# Patient Record
Sex: Female | Born: 1955 | Race: White | Hispanic: No | Marital: Married | Smoking: Never smoker
Health system: Southern US, Community
[De-identification: ages and names within clinical notes are randomized; demographics above are authoritative.]

## PROBLEM LIST (undated history)

## (undated) DIAGNOSIS — I10 Essential (primary) hypertension: Secondary | ICD-10-CM

## (undated) HISTORY — DX: Essential (primary) hypertension: I10

## (undated) HISTORY — PX: COLONOSCOPY: SHX174

## (undated) HISTORY — PX: CHOLECYSTECTOMY: SHX55

---

## 1998-03-11 ENCOUNTER — Ambulatory Visit (HOSPITAL_COMMUNITY): Admission: RE | Admit: 1998-03-11 | Discharge: 1998-03-11 | Payer: Self-pay | Admitting: Oncology

## 2004-02-09 ENCOUNTER — Other Ambulatory Visit: Payer: Self-pay

## 2007-05-22 ENCOUNTER — Ambulatory Visit: Payer: Self-pay | Admitting: Family Medicine

## 2009-01-30 ENCOUNTER — Emergency Department: Payer: Self-pay | Admitting: Emergency Medicine

## 2010-03-30 ENCOUNTER — Emergency Department: Payer: Self-pay | Admitting: Emergency Medicine

## 2011-02-03 ENCOUNTER — Ambulatory Visit: Payer: Self-pay | Admitting: Internal Medicine

## 2011-10-29 ENCOUNTER — Emergency Department: Payer: Self-pay | Admitting: Unknown Physician Specialty

## 2011-11-05 ENCOUNTER — Emergency Department: Payer: Self-pay | Admitting: Emergency Medicine

## 2011-11-12 ENCOUNTER — Ambulatory Visit: Payer: Self-pay | Admitting: Family Medicine

## 2011-11-12 LAB — CREATININE, SERUM
Creatinine: 0.64 mg/dL (ref 0.60–1.30)
EGFR (African American): >60
EGFR (Non-African Amer.): >60

## 2011-12-17 DIAGNOSIS — Z1211 Encounter for screening for malignant neoplasm of colon: Secondary | ICD-10-CM | POA: Insufficient documentation

## 2011-12-31 DIAGNOSIS — K648 Other hemorrhoids: Secondary | ICD-10-CM | POA: Insufficient documentation

## 2012-06-18 ENCOUNTER — Ambulatory Visit: Payer: Self-pay | Admitting: Family Medicine

## 2012-06-20 ENCOUNTER — Emergency Department: Payer: Self-pay | Admitting: *Deleted

## 2012-06-20 LAB — COMPREHENSIVE METABOLIC PANEL
Albumin: 3.8 g/dL (ref 3.4–5.0)
Alkaline Phosphatase: 93 U/L (ref 50–136)
Anion Gap: 4 — ABNORMAL LOW (ref 7–16)
BUN: 13 mg/dL (ref 7–18)
Bilirubin,Total: 0.2 mg/dL (ref 0.2–1.0)
Calcium, Total: 9.5 mg/dL (ref 8.5–10.1)
Chloride: 106 mmol/L (ref 98–107)
Co2: 32 mmol/L (ref 21–32)
Creatinine: 0.79 mg/dL (ref 0.60–1.30)
EGFR (African American): >60
EGFR (Non-African Amer.): >60
Glucose: 88 mg/dL (ref 65–99)
Osmolality: 283 (ref 275–301)
Potassium: 3.8 mmol/L (ref 3.5–5.1)
SGOT(AST): 20 U/L (ref 15–37)
SGPT (ALT): 23 U/L (ref 12–78)
Sodium: 142 mmol/L (ref 136–145)
Total Protein: 7.3 g/dL (ref 6.4–8.2)

## 2012-06-20 LAB — CBC
HCT: 41.7 % (ref 35.0–47.0)
HGB: 14.5 g/dL (ref 12.0–16.0)
MCH: 33.3 pg (ref 26.0–34.0)
MCHC: 34.7 g/dL (ref 32.0–36.0)
MCV: 96 fL (ref 80–100)
Platelet: 188 10*3/uL (ref 150–440)
RBC: 4.35 10*6/uL (ref 3.80–5.20)
RDW: 12.5 % (ref 11.5–14.5)
WBC: 8.6 10*3/uL (ref 3.6–11.0)

## 2012-06-20 LAB — URINALYSIS, COMPLETE
Bacteria: NONE SEEN
Bilirubin,UR: NEGATIVE
Blood: NEGATIVE
Glucose,UR: NEGATIVE mg/dL (ref 0–75)
Ketone: NEGATIVE
Leukocyte Esterase: NEGATIVE
Nitrite: NEGATIVE
Ph: 7 (ref 4.5–8.0)
Protein: NEGATIVE
RBC,UR: 1 /HPF (ref 0–5)
Specific Gravity: 1.003 (ref 1.003–1.030)
Squamous Epithelial: <1
WBC UR: <1 /HPF (ref 0–5)

## 2012-06-20 LAB — LIPASE, BLOOD: Lipase: 113 U/L (ref 73–393)

## 2012-06-30 ENCOUNTER — Ambulatory Visit: Payer: Self-pay | Admitting: Surgery

## 2012-07-02 LAB — PATHOLOGY REPORT

## 2013-04-29 ENCOUNTER — Ambulatory Visit: Payer: Self-pay | Admitting: Family Medicine

## 2013-11-10 ENCOUNTER — Ambulatory Visit: Payer: Self-pay | Admitting: Family Medicine

## 2014-01-26 ENCOUNTER — Other Ambulatory Visit: Payer: Self-pay | Admitting: Podiatry

## 2014-01-26 ENCOUNTER — Encounter: Payer: Self-pay | Admitting: Podiatry

## 2014-02-11 ENCOUNTER — Ambulatory Visit: Payer: Self-pay | Admitting: Emergency Medicine

## 2014-03-25 DIAGNOSIS — M79671 Pain in right foot: Secondary | ICD-10-CM | POA: Insufficient documentation

## 2014-03-25 DIAGNOSIS — Z8669 Personal history of other diseases of the nervous system and sense organs: Secondary | ICD-10-CM | POA: Insufficient documentation

## 2014-03-25 DIAGNOSIS — I10 Essential (primary) hypertension: Secondary | ICD-10-CM | POA: Insufficient documentation

## 2014-03-25 DIAGNOSIS — G43909 Migraine, unspecified, not intractable, without status migrainosus: Secondary | ICD-10-CM | POA: Insufficient documentation

## 2014-06-17 ENCOUNTER — Ambulatory Visit: Payer: Self-pay | Admitting: Physician Assistant

## 2015-02-22 NOTE — H&P (Signed)
PATIENT NAME:  Deborah Estrada, Deborah Estrada MR#:  [id number] DATE OF BIRTH:  Dec 12, 1955  DATE OF ADMISSION:  06/30/2012  CHIEF COMPLAINT: Right upper quadrant pain.   HISTORY OF PRESENT ILLNESS: This patient was seen in the office on August 20th. She was describing several weeks of abdominal pain in the right upper quadrant with nausea and vomiting. She had no jaundice, acholic stools. She had been constipated, had not had any fevers or chills. She had had prior episodes of this leading up to this most recent event. At the time, the patient was diagnosed with gallstones and probable acute cholecystitis, and I had recommended admission to the hospital with urgent laparoscopic cholecystectomy; and once I discussed the risks associated with the procedure, the patient refused admission and did not want to have surgery. The patient called back later stating that she would consider surgery and would be interested in exposing herself to the risks that I had discussed; therefore, she is here for elective laparoscopic cholecystectomy for the diagnosis of gallstones and possible acute cholecystitis. Of note, the patient will be consented concerning those risks that we had discussed in the office, and these will be reviewed in detail for her in the preop holding area prior to the start of surgery.   PAST MEDICAL HISTORY: Hypertension.   PAST SURGICAL HISTORY: None.   MEDICATIONS: Toprol.   ALLERGIES: None.   FAMILY HISTORY: Noncontributory.   SOCIAL HISTORY: The patient does not smoke nor does she drink.   REVIEW OF SYSTEMS: Ten-system review has been performed and negative with the exception of that mentioned in the history of present illness.   PHYSICAL EXAMINATION:  GENERAL: Uncomfortable-appearing Caucasian female patient.   HEENT: No scleral icterus.   NECK: No palpable neck nodes.   CHEST: Clear to auscultation.   CARDIAC: Regular rate and rhythm.   ABDOMEN: Tender in the right upper quadrant with a  positive Murphy's sign.   EXTREMITIES: Extremities are without edema. Calves are nontender.   NEUROLOGIC: Grossly intact.   INTEGUMENT: No jaundice.   LABORATORY, DIAGNOSTIC AND RADIOLOGICAL DATA: An ultrasound performed on the 16th of August demonstrated gallstones and sludge and a positive sonographic Murphy's sign. Laboratory values done on August 19th showed a white blood cell count of 8.6, hemoglobin and hematocrit of 14.5 and 42, AST/ALT were within normal limits.   ASSESSMENT AND PLAN: This is a patient with probable acute cholecystitis. She was seen in the office on the 20th at which time admission to the hospital and urgent cholecystectomy was suggested. The patient refused at that time making statements such as, paraphrasing, she did not want to have an open procedure and certainly did not want to have her gallbladder removed through her navel as her husband had.  I discussed the risks of bleeding and infection and recurrence of symptoms, failure to resolve her symptoms, open procedure, bile duct damage, bile duct leak, retained common bile duct stone, any of which could require further surgery and/or ERCP, stent, and papillotomy. I also discussed with her the details of the planned procedure at that time, and she refused admission and refused surgery. The patient called back later stating that she had decided that she would want to undergo surgery, and we scheduled her electively. These risks will be reviewed in detail in the preop holding area as well so that she is satisfactorily consented, and we will proceed with laparoscopic cholecystectomy on 06/30/2012.  ____________________________ Adah Salvage. Excell Seltzer, MD rec:cbb D: 06/29/2012 17:38:27 ET T: 06/30/2012 06:36:02  ET JOB#: [id number] Deborah HawICHARD E Ivyonna Hoelzel MD ELECTRONICALLY SIGNED 07/07/2012 7:18

## 2015-02-22 NOTE — Op Note (Signed)
PATIENT NAME:  Deborah Estrada, Deborah L MR#:  [id number] DATE OF BIRTH:  1956/02/15  DATE OF PROCEDURE:  06/30/2012  PREOPERATIVE DIAGNOSIS: Symptomatic cholelithiasis.   POSTOPERATIVE DIAGNOSIS: Symptomatic cholelithiasis.    PROCEDURE:  Laparoscopic cholecystectomy.   SURGEON: Dionne Miloichard Aaryan Essman, M.D.   ANESTHESIA: General with endotracheal tube.   INDICATIONS: This is a patient with epigastric right upper quadrant pain associated with fatty food intolerance and work-up showing gallstones and probable acute cholecystitis. I tried to admit her from the office for urgent laparoscopy last week and she refused to consent. Preoperatively in the office and then in the preop holding area, I reviewed with she and her family the rationale for offering surgery and the options of observation. I also discussed the risks of bleeding, infection, open procedure, conversion to an open procedure, bowel injury, bile duct damage, bile duct leak, retained common bile duct stone, any of which could require further surgery and/or ERCP, stent, and papillotomy. Multiple questions were answered for her and her family. She voiced understanding and agreement with the plan.   FINDINGS: Signs of chronic cholecystitis with multiple adhesions and engorged vessels.   DESCRIPTION OF PROCEDURE: The patient was induced to general anesthesia, given IV antibiotics, VTE prophylaxis was in place. She was prepped and draped in a sterile fashion. Marcaine was infiltrated in skin and subcutaneous tissues around the periumbilical area. Incision was made. A Veress needle was placed. Pneumoperitoneum was obtained and a 5 mm trocar port was placed. The abdominal cavity was explored and under direct vision a 10 mm epigastric port and two lateral 5 mm ports were placed. The gallbladder was placed on tension. Adhesions were taken down bluntly and sharply without the use of energy. The peritoneum over the infundibulum was incised bluntly. The cystic duct  gallbladder junction was well identified. Cystic lymphatics were doubly clipped and divided and then two branches of the cystic artery were doubly clipped and divided and the cystic vein was doubly clipped and divided as well. The gallbladder junction with the cystic duct was easily identifiable at this point. Therefore, it was doubly clipped and divided and the gallbladder was taken from the gallbladder fossa with electrocautery and passed out through the epigastric port site with the aid of an EndoCatch bag. The area was checked for hemostasis. Additional electrocautery was utilized in the area of the gallbladder fossa with good hemostasis at the end of the case. However, a piece of Surgicel was placed on this spot as well. No further bleeding was noted. Irrigation was aspirated and the camera was placed in the epigastric site to view back to the periumbilical site. There was no sign of bowel injury, bleeding or bile leak; therefore, pneumoperitoneum was released. All ports were removed. Fascial edges at the epigastric site were approximated with figure-of-eight 0 Vicryls. 4-0 subcuticular Monocryl was used at all skin edges. Steri-Strips, Mastisol, and sterile dressings were placed.   The patient tolerated the procedure well. There were no complications. She was taken to the recovery room in stable condition to be discharged to the care of her family. Follow-up in 10 days.   ____________________________ Adah Salvageichard E. Excell Seltzerooper, MD rec:ap D: 06/30/2012 10:09:36 ET T: 06/30/2012 12:06:00 ET JOB#: [id number]  cc: Adah Salvageichard E. Excell Seltzerooper, MD, <Dictator> Lattie HawICHARD E Marcellino Fidalgo MD ELECTRONICALLY SIGNED 07/07/2012 7:18

## 2015-11-08 ENCOUNTER — Ambulatory Visit: Payer: Self-pay | Admitting: Family Medicine

## 2015-12-06 ENCOUNTER — Ambulatory Visit: Payer: Self-pay | Admitting: Family Medicine

## 2016-07-24 DIAGNOSIS — G8929 Other chronic pain: Secondary | ICD-10-CM | POA: Insufficient documentation

## 2016-07-24 DIAGNOSIS — M542 Cervicalgia: Secondary | ICD-10-CM | POA: Insufficient documentation

## 2016-07-24 DIAGNOSIS — M5442 Lumbago with sciatica, left side: Secondary | ICD-10-CM

## 2016-07-24 DIAGNOSIS — M5430 Sciatica, unspecified side: Secondary | ICD-10-CM | POA: Insufficient documentation

## 2016-07-24 DIAGNOSIS — M25512 Pain in left shoulder: Secondary | ICD-10-CM

## 2016-07-24 DIAGNOSIS — M25511 Pain in right shoulder: Secondary | ICD-10-CM

## 2016-07-24 DIAGNOSIS — Z833 Family history of diabetes mellitus: Secondary | ICD-10-CM | POA: Insufficient documentation

## 2016-09-06 ENCOUNTER — Ambulatory Visit
Admission: EM | Admit: 2016-09-06 | Discharge: 2016-09-06 | Disposition: A | Discharge status: Home / Self Care | Payer: BLUE CROSS/BLUE SHIELD | Attending: Family Medicine | Admitting: Family Medicine

## 2016-09-06 DIAGNOSIS — J069 Acute upper respiratory infection, unspecified: Secondary | ICD-10-CM

## 2016-09-06 DIAGNOSIS — B9789 Other viral agents as the cause of diseases classified elsewhere: Secondary | ICD-10-CM | POA: Diagnosis not present

## 2016-09-06 LAB — RAPID STREP SCREEN (MED CTR MEBANE ONLY): Streptococcus, Group A Screen (Direct): NEGATIVE

## 2016-09-06 MED ORDER — AZITHROMYCIN 250 MG PO TABS: ORAL_TABLET | ORAL | 0 refills | Status: DC

## 2016-09-06 MED ORDER — FEXOFENADINE-PSEUDOEPHED ER 180-240 MG PO TB24: 1.0000 | ORAL_TABLET | Freq: Every day | ORAL | 0 refills | Status: DC

## 2016-09-06 MED ORDER — FLUTICASONE PROPIONATE 50 MCG/ACT NA SUSP: 2.0000 | Freq: Every day | NASAL | 0 refills | Status: DC

## 2016-09-06 MED ORDER — HYDROCOD POLST-CPM POLST ER 10-8 MG/5ML PO SUER: 5.0000 mL | Freq: Two times a day (BID) | ORAL | 0 refills | Status: DC | PRN

## 2016-09-06 NOTE — ED Provider Notes (Signed)
[facility]    CSN: [id number] Arrival date & time: 09/06/16  1637     History   Chief Complaint Chief Complaint  Patient presents with  . Sinusitis    HPI Deborah Estrada is a 60 y.o. female.   Patient with a 2 day history of URI symptoms. She states she feels miserable. Everything started Tuesday afternoon and is Thursday afternoon and. She states she's not able to sleep at night because the nasal drainage and cough and congestion. Her throat sore but she never gets strep. She denies any wheezing she does not smoke she did not have any known fever. She does have history high blood pressure. There is a history of breast cancer mother and heart attack in father. No known drug allergies. She states she normally to work and feels miserable because she does also feel achy all over   The history is provided by the patient. No language interpreter was used.  Sinusitis  Pain details:    Location:  Maxillary   Quality:  Aching   Severity:  Moderate   Duration:  3 days   Timing:  Constant Duration:  3 days Progression:  Worsening Chronicity:  New Context: recent URI   Context: not smoke inhalation   Relieved by:  Nothing Associated symptoms: congestion, cough, rhinorrhea and sore throat     Past Medical History:  Diagnosis Date  . HBP (high blood pressure)     There are no active problems to display for this patient.   Past Surgical History:  Procedure Laterality Date  . CHOLECYSTECTOMY    . COLONOSCOPY      OB History    No data available       Home Medications    Prior to Admission medications   Medication Sig Start Date End Date Taking? Authorizing Provider  meloxicam (MOBIC) 15 MG tablet Take 15 mg by mouth daily.   Yes Historical Provider, MD  azithromycin (ZITHROMAX Z-PAK) 250 MG tablet Take 2 tablets first day and then 1 po a day for 4 days patient may fill this prescription between 09/09/2016 and 10/04/2016 if symptoms still persist  09/06/16   Hassan Rowan, MD  celecoxib (CELEBREX) 200 MG capsule Take 200 mg by mouth daily.    Historical Provider, MD  chlorpheniramine-HYDROcodone (TUSSIONEX PENNKINETIC ER) 10-8 MG/5ML SUER Take 5 mLs by mouth every 12 (twelve) hours as needed for cough. 09/06/16   Hassan Rowan, MD  diclofenac (VOLTAREN) 75 MG EC tablet Take 75 mg by mouth 2 (two) times daily.    Historical Provider, MD  fexofenadine-pseudoephedrine (ALLEGRA-D ALLERGY & CONGESTION) 180-240 MG 24 hr tablet Take 1 tablet by mouth daily. 09/06/16   Hassan Rowan, MD  fluticasone (FLONASE) 50 MCG/ACT nasal spray Place 2 sprays into both nostrils daily. 09/06/16   Hassan Rowan, MD  Naproxen Sodium (NAPRELAN) 750 MG TB24 Take by mouth daily.    Historical Provider, MD    Family History Family History  Problem Relation Age of Onset  . Breast cancer Mother   . Heart attack Father     Social History Social History  Substance Use Topics  . Smoking status: Never Smoker  . Smokeless tobacco: Never Used  . Alcohol use Yes     Comment: social     Allergies   Review of patient's allergies indicates no known allergies.   Review of Systems Review of Systems  HENT: Positive for congestion, facial swelling, postnasal drip, rhinorrhea, sinus pressure and sore throat.  Respiratory: Positive for cough.   All other systems reviewed and are negative.    Physical Exam Triage Vital Signs ED Triage Vitals  Enc Vitals Group     BP 09/06/16 1752 127/69     Pulse Rate 09/06/16 1752 74     Resp 09/06/16 1752 17     Temp 09/06/16 1752 98 F (36.7 C)     Temp Source 09/06/16 1752 Oral     SpO2 09/06/16 1752 99 %     Weight 09/06/16 1749 150 lb (68 kg)     Height 09/06/16 1749 5\' 4"  (1.626 m)     Head Circumference --      Peak Flow --      Pain Score 09/06/16 1752 8     Pain Loc --      Pain Edu? --      Excl. in GC? --    No data found.   Updated Vital Signs BP 127/69 (BP Location: Left Arm)   Pulse 74   Temp 98 F (36.7  C) (Oral)   Resp 17   Ht 5\' 4"  (1.626 m)   Wt 150 lb (68 kg)   SpO2 99%   BMI 25.75 kg/m   Visual Acuity Right Eye Distance:   Left Eye Distance:   Bilateral Distance:    Right Eye Near:   Left Eye Near:    Bilateral Near:     Physical Exam  Constitutional: She is oriented to person, place, and time. She appears well-developed and well-nourished.  HENT:  Head: Normocephalic and atraumatic.  Right Ear: Hearing, tympanic membrane, external ear and ear canal normal.  Left Ear: Hearing, tympanic membrane, external ear and ear canal normal.  Nose: Mucosal edema, rhinorrhea and sinus tenderness present. No nasal deformity or septal deviation. No epistaxis.  No foreign bodies. Right sinus exhibits no maxillary sinus tenderness and no frontal sinus tenderness. Left sinus exhibits maxillary sinus tenderness. Left sinus exhibits no frontal sinus tenderness.  Mouth/Throat: Uvula is midline and mucous membranes are normal. No oral lesions. No dental abscesses. Posterior oropharyngeal erythema present.  Eyes: Pupils are equal, round, and reactive to light.  Neck: Normal range of motion. Neck supple.  Cardiovascular: Normal rate and regular rhythm.   Pulmonary/Chest: Effort normal and breath sounds normal.  Musculoskeletal: Normal range of motion.  Lymphadenopathy:    She has cervical adenopathy.  Neurological: She is alert and oriented to person, place, and time.  Skin: Skin is warm.  Psychiatric: She has a normal mood and affect.  Vitals reviewed.    UC Treatments / Results  Labs (all labs ordered are listed, but only abnormal results are displayed) Labs Reviewed  RAPID STREP [facility]CREEN ([facility])  CULTURE, GROUP A STREP [facility])    EKG  EKG Interpretation None       Radiology No results found.  Procedures Procedures (including critical care time)  Medications Ordered in UC Medications - No data to display   Initial Impression / Assessment and Plan / UC Course  I  have reviewed the triage vital signs and the nursing notes.  Pertinent labs & imaging results that were available during my care of the patient were reviewed by me and considered in my medical decision making (see chart for details).  Clinical Course  Patient states that she's never had strep strep test is pending. Strep test is negative will treat more febrile illness may give a Z-Pak to be filled on Sunday if not better but  otherwise this appears be a viral illness. We'll place on Allegra-D and Tussionex 1 teaspoon twice a day Flonase nasal spray 2 puffs each nostril daily and follow-up PCP in a week if not better. Will give a work note for today and tomorrow as well  Final Clinical Impressions(s) / UC Diagnoses   Final diagnoses:  Viral URI    New Prescriptions New Prescriptions   AZITHROMYCIN (ZITHROMAX Z-PAK) 250 MG TABLET    Take 2 tablets first day and then 1 po a day for 4 days patient may fill this prescription between 09/09/2016 and 10/04/2016 if symptoms still persist   CHLORPHENIRAMINE-HYDROCODONE (TUSSIONEX PENNKINETIC ER) 10-8 MG/5ML SUER    Take 5 mLs by mouth every 12 (twelve) hours as needed for cough.   FEXOFENADINE-PSEUDOEPHEDRINE (ALLEGRA-D ALLERGY & CONGESTION) 180-240 MG 24 HR TABLET    Take 1 tablet by mouth daily.   FLUTICASONE (FLONASE) 50 MCG/ACT NASAL SPRAY    Place 2 sprays into both nostrils daily.     Note: This dictation was prepared with Dragon dictation along with smaller phrase technology. Any transcriptional errors that result from this process are unintentional.   Hassan RowanEugene Kealohilani Maiorino, MD 09/06/16 1949

## 2016-09-06 NOTE — ED Triage Notes (Signed)
Patient complains of sinus pain and pressure x 1 week. Patient states that she has been having cough so hard she feels like she will vomit. Patient states that her nose has been runny as well with a headache. Patient states that she has also been fatigued.

## 2016-09-09 ENCOUNTER — Telehealth: Payer: Self-pay | Admitting: *Deleted

## 2016-09-09 LAB — CULTURE, GROUP A STREP (THRC)

## 2016-09-15 ENCOUNTER — Encounter: Payer: Self-pay | Admitting: Gynecology

## 2016-09-15 ENCOUNTER — Ambulatory Visit
Admission: EM | Admit: 2016-09-15 | Discharge: 2016-09-15 | Disposition: A | Discharge status: Home / Self Care | Payer: BLUE CROSS/BLUE SHIELD | Attending: Emergency Medicine | Admitting: Emergency Medicine

## 2016-09-15 DIAGNOSIS — J209 Acute bronchitis, unspecified: Secondary | ICD-10-CM

## 2016-09-15 MED ORDER — PREDNISONE 10 MG PO TABS: 20.0000 mg | ORAL_TABLET | Freq: Every day | ORAL | 0 refills | Status: DC

## 2016-09-15 MED ORDER — ALBUTEROL SULFATE HFA 108 (90 BASE) MCG/ACT IN AERS: 1.0000 | INHALATION_SPRAY | Freq: Four times a day (QID) | RESPIRATORY_TRACT | 0 refills | Status: DC | PRN

## 2016-09-15 MED ORDER — HYDROCOD POLST-CPM POLST ER 10-8 MG/5ML PO SUER: 5.0000 mL | Freq: Two times a day (BID) | ORAL | 0 refills | Status: DC

## 2016-09-15 MED ORDER — FLUCONAZOLE 150 MG PO TABS: ORAL_TABLET | ORAL | 0 refills | Status: DC

## 2016-09-15 MED ORDER — AMOXICILLIN-POT CLAVULANATE 875-125 MG PO TABS: 1.0000 | ORAL_TABLET | Freq: Two times a day (BID) | ORAL | 0 refills | Status: DC

## 2016-09-15 NOTE — ED Provider Notes (Signed)
CSN: [id number]     Arrival date & time 09/15/16  1228 History   First MD Initiated Contact with Patient 09/15/16 1403     Chief Complaint  Patient presents with  . Cough   (Consider location/radiation/quality/duration/timing/severity/associated sxs/prior Treatment) HPI  This a 60 year old female who presents week ago when she was seen treated for an upper respiratory infection with a Z-Pak that she took but did not have any results. She is also been using her Tussionex and despite that has been having constant coughing. She is upset because her chest hurts when she coughs she has not improved. His never smoked. O2 sats are 100% on room air. She is afebrile and does not have any fevers. He does have a productive cough of green-yellow sputum.       Past Medical History:  Diagnosis Date  . HBP (high blood pressure)    Past Surgical History:  Procedure Laterality Date  . CHOLECYSTECTOMY    . COLONOSCOPY     Family History  Problem Relation Age of Onset  . Breast cancer Mother   . Heart attack Father    Social History  Substance Use Topics  . Smoking status: Never Smoker  . Smokeless tobacco: Never Used  . Alcohol use Yes     Comment: social   OB History    No data available     Review of Systems  Constitutional: Positive for activity change. Negative for chills, fatigue and fever.  HENT: Positive for congestion.   Respiratory: Positive for cough and shortness of breath. Negative for wheezing and stridor.   Cardiovascular: Positive for chest pain.  All other systems reviewed and are negative.   Allergies  Patient has no known allergies.  Home Medications   Prior to Admission medications   Medication Sig Start Date End Date Taking? Authorizing Provider  azithromycin (ZITHROMAX Z-PAK) 250 MG tablet Take 2 tablets first day and then 1 po a day for 4 days patient may fill this prescription between 09/09/2016 and 10/04/2016 if symptoms still persist 09/06/16  Yes Hassan Rowan, MD  celecoxib (CELEBREX) 200 MG capsule Take 200 mg by mouth daily.   Yes Historical Provider, MD  diclofenac (VOLTAREN) 75 MG EC tablet Take 75 mg by mouth 2 (two) times daily.   Yes Historical Provider, MD  fexofenadine-pseudoephedrine (ALLEGRA-D ALLERGY & CONGESTION) 180-240 MG 24 hr tablet Take 1 tablet by mouth daily. 09/06/16  Yes Hassan Rowan, MD  fluticasone (FLONASE) 50 MCG/ACT nasal spray Place 2 sprays into both nostrils daily. 09/06/16  Yes Hassan Rowan, MD  meloxicam (MOBIC) 15 MG tablet Take 15 mg by mouth daily.   Yes Historical Provider, MD  Naproxen Sodium (NAPRELAN) 750 MG TB24 Take by mouth daily.   Yes Historical Provider, MD  albuterol (PROVENTIL HFA;VENTOLIN HFA) 108 (90 Base) MCG/ACT inhaler Inhale 1-2 puffs into the lungs every 6 (six) hours as needed for wheezing or shortness of breath. Use with spacer 09/15/16   Lutricia Feil, PA-C  amoxicillin-clavulanate (AUGMENTIN) 875-125 MG tablet Take 1 tablet by mouth every 12 (twelve) hours. 09/15/16   Lutricia Feil, PA-C  chlorpheniramine-HYDROcodone (TUSSIONEX PENNKINETIC ER) 10-8 MG/5ML SUER Take 5 mLs by mouth 2 (two) times daily. 09/15/16   Lutricia Feil, PA-C  fluconazole (DIFLUCAN) 150 MG tablet Take one tab for symptoms of yeast infection. Repeat x 1 in 72 hours. 09/15/16   Lutricia Feil, PA-C  predniSONE (DELTASONE) 10 MG tablet Take 2 tablets (20 mg total) by mouth  daily. 09/15/16   Lutricia FeilWilliam P Roemer, PA-C   Meds Ordered and Administered this Visit  Medications - No data to display  BP 135/89 (BP Location: Left Arm)   Pulse 64   Temp 98 F (36.7 C) (Oral)   Resp 16   Ht 5\' 4"  (1.626 m)   Wt 150 lb (68 kg)   SpO2 100%   BMI 25.75 kg/m  No data found.   Physical Exam  Constitutional: She is oriented to person, place, and time. She appears well-developed and well-nourished. No distress.  HENT:  Head: Normocephalic and atraumatic.  Right Ear: External ear normal.  Left Ear: External ear normal.   Nose: Nose normal.  Mouth/Throat: Oropharynx is clear and moist. No oropharyngeal exudate.  Eyes: EOM are normal. Pupils are equal, round, and reactive to light. Right eye exhibits no discharge. Left eye exhibits no discharge.  Neck: Normal range of motion. Neck supple.  Pulmonary/Chest: Effort normal and breath sounds normal. No respiratory distress. She has no wheezes. She has no rales.  Musculoskeletal: Normal range of motion.  Lymphadenopathy:    She has no cervical adenopathy.  Neurological: She is alert and oriented to person, place, and time.  Skin: Skin is warm and dry. She is not diaphoretic.  Psychiatric: She has a normal mood and affect. Her behavior is normal. Judgment and thought content normal.  Nursing note and vitals reviewed.   Urgent Care Course   Clinical Course     Procedures (including critical care time)  Labs Review Labs Reviewed - No data to display  Imaging Review No results found.   Visual Acuity Review  Right Eye Distance:   Left Eye Distance:   Bilateral Distance:    Right Eye Near:   Left Eye Near:    Bilateral Near:         MDM   1. Acute bronchitis, unspecified organism    Discharge Medication List as of 09/15/2016  2:28 PM    START taking these medications   Details  albuterol (PROVENTIL HFA;VENTOLIN HFA) 108 (90 Base) MCG/ACT inhaler Inhale 1-2 puffs into the lungs every 6 (six) hours as needed for wheezing or shortness of breath. Use with spacer, Starting Sat 09/15/2016, Normal    amoxicillin-clavulanate (AUGMENTIN) 875-125 MG tablet Take 1 tablet by mouth every 12 (twelve) hours., Starting Sat 09/15/2016, Normal    fluconazole (DIFLUCAN) 150 MG tablet Take one tab for symptoms of yeast infection. Repeat x 1 in 72 hours., Normal    predniSONE (DELTASONE) 10 MG tablet Take 2 tablets (20 mg total) by mouth daily., Starting Sat 09/15/2016, Normal      Plan: 1. Test/x-ray results and diagnosis reviewed with patient 2. rx as  per orders; risks, benefits, potential side effects reviewed with patient 3. Recommend supportive treatment with Use of albuterol and prednisone to help quell her coughing. I tried to explain to the patient that she likely has a bronchitis that the coughing could last up to 6 weeks. The Z-Pak did not work because she has a virus. Despite a long explanation and argument with the patient, she is adamant that she would take to try another medication. She has no physical findings suggestive of pneumonia and I have decided not to perform an x-ray. I told her that is very unlikely that she will improve with the new antibiotic but in order to placate the patient I have prescribed Augmentin for 7 days. She is not improving she needs to follow-up with her primary care  physician for further evaluation and imaging.  4. F/u prn if symptoms worsen or don't improve     Lutricia FeilWilliam P Roemer, PA-C 09/15/16 1716

## 2016-09-15 NOTE — ED Triage Notes (Signed)
Per patient c/o cough x over a week. Patient was seen x 1 week ago and not feeling any better.

## 2016-10-04 DIAGNOSIS — R300 Dysuria: Secondary | ICD-10-CM | POA: Insufficient documentation

## 2016-10-04 DIAGNOSIS — N949 Unspecified condition associated with female genital organs and menstrual cycle: Secondary | ICD-10-CM | POA: Insufficient documentation

## 2016-10-17 ENCOUNTER — Ambulatory Visit
Admission: EM | Admit: 2016-10-17 | Discharge: 2016-10-17 | Disposition: A | Discharge status: Home / Self Care | Payer: BLUE CROSS/BLUE SHIELD | Attending: Family Medicine | Admitting: Family Medicine

## 2016-10-17 ENCOUNTER — Encounter: Payer: Self-pay | Admitting: *Deleted

## 2016-10-17 DIAGNOSIS — R05 Cough: Secondary | ICD-10-CM

## 2016-10-17 DIAGNOSIS — H6501 Acute serous otitis media, right ear: Secondary | ICD-10-CM

## 2016-10-17 DIAGNOSIS — R059 Cough, unspecified: Secondary | ICD-10-CM

## 2016-10-17 MED ORDER — HYDROCOD POLST-CPM POLST ER 10-8 MG/5ML PO SUER: 5.0000 mL | Freq: Two times a day (BID) | ORAL | 0 refills | Status: DC | PRN

## 2016-10-17 MED ORDER — FLUCONAZOLE 150 MG PO TABS: 150.0000 mg | ORAL_TABLET | Freq: Every day | ORAL | 1 refills | Status: DC

## 2016-10-17 MED ORDER — AMOXICILLIN 875 MG PO TABS: 875.0000 mg | ORAL_TABLET | Freq: Two times a day (BID) | ORAL | 0 refills | Status: DC

## 2016-10-17 NOTE — ED Provider Notes (Signed)
[facility]    CSN: [id number] Arrival date & time: 10/17/16  1647     History   Chief Complaint Chief Complaint  Patient presents with  . Otalgia  . Facial Pain  . Headache  . Cough  . Generalized Body Aches    HPI Deborah Estrada is a 60 y.o. female.   The history is provided by the patient.  Otalgia  Associated symptoms: congestion, cough, headaches and rhinorrhea   Headache  Associated symptoms: congestion, cough, ear pain and URI   Cough  Associated symptoms: ear pain, headaches and rhinorrhea   Associated symptoms: no wheezing   URI  Presenting symptoms: congestion, cough, ear pain and rhinorrhea   Severity:  Moderate Onset quality:  Sudden Duration:  5 days Timing:  Constant Progression:  Worsening Chronicity:  New Relieved by:  Nothing Ineffective treatments:  OTC medications Associated symptoms: headaches   Associated symptoms: no wheezing   Risk factors: sick contacts   Risk factors: not elderly, no chronic cardiac disease, no chronic kidney disease, no chronic respiratory disease, no diabetes mellitus, no immunosuppression, no recent illness and no recent travel     Past Medical History:  Diagnosis Date  . HBP (high blood pressure)     There are no active problems to display for this patient.   Past Surgical History:  Procedure Laterality Date  . CHOLECYSTECTOMY    . COLONOSCOPY      OB History    No data available       Home Medications    Prior to Admission medications   Medication Sig Start Date End Date Taking? Authorizing Provider  albuterol (PROVENTIL HFA;VENTOLIN HFA) 108 (90 Base) MCG/ACT inhaler Inhale 1-2 puffs into the lungs every 6 (six) hours as needed for wheezing or shortness of breath. Use with spacer 09/15/16   Lutricia FeilWilliam P Roemer, PA-C  amoxicillin (AMOXIL) 875 MG tablet Take 1 tablet (875 mg total) by mouth 2 (two) times daily. 10/17/16   Payton Mccallumrlando Tahmid Stonehocker, MD  azithromycin (ZITHROMAX Z-PAK) 250 MG  tablet Take 2 tablets first day and then 1 po a day for 4 days patient may fill this prescription between 09/09/2016 and 10/04/2016 if symptoms still persist 09/06/16   Hassan RowanEugene Wade, MD  celecoxib (CELEBREX) 200 MG capsule Take 200 mg by mouth daily.    Historical Provider, MD  chlorpheniramine-HYDROcodone (TUSSIONEX PENNKINETIC ER) 10-8 MG/5ML SUER Take 5 mLs by mouth every 12 (twelve) hours as needed. 10/17/16   Payton Mccallumrlando Sharie Amorin, MD  diclofenac (VOLTAREN) 75 MG EC tablet Take 75 mg by mouth 2 (two) times daily.    Historical Provider, MD  fexofenadine-pseudoephedrine (ALLEGRA-D ALLERGY & CONGESTION) 180-240 MG 24 hr tablet Take 1 tablet by mouth daily. 09/06/16   Hassan RowanEugene Wade, MD  fluconazole (DIFLUCAN) 150 MG tablet Take 1 tablet (150 mg total) by mouth daily. 10/17/16   Payton Mccallumrlando Kadin Canipe, MD  fluticasone (FLONASE) 50 MCG/ACT nasal spray Place 2 sprays into both nostrils daily. 09/06/16   Hassan RowanEugene Wade, MD  meloxicam (MOBIC) 15 MG tablet Take 15 mg by mouth daily.    Historical Provider, MD  Naproxen Sodium (NAPRELAN) 750 MG TB24 Take by mouth daily.    Historical Provider, MD  predniSONE (DELTASONE) 10 MG tablet Take 2 tablets (20 mg total) by mouth daily. 09/15/16   Lutricia FeilWilliam P Roemer, PA-C    Family History Family History  Problem Relation Age of Onset  . Breast cancer Mother   . Heart attack Father     Social  History Social History  Substance Use Topics  . Smoking status: Never Smoker  . Smokeless tobacco: Never Used  . Alcohol use Yes     Comment: social     Allergies   Patient has no known allergies.   Review of Systems Review of Systems  HENT: Positive for congestion, ear pain and rhinorrhea.   Respiratory: Positive for cough. Negative for wheezing.   Neurological: Positive for headaches.     Physical Exam Triage Vital Signs ED Triage Vitals  Enc Vitals Group     BP 10/17/16 1706 (!) 150/93     Pulse Rate 10/17/16 1706 79     Resp 10/17/16 1706 16     Temp 10/17/16 1706 97.5  F (36.4 C)     Temp Source 10/17/16 1706 Oral     SpO2 10/17/16 1706 98 %     Weight 10/17/16 1709 155 lb (70.3 kg)     Height 10/17/16 1709 5\' 4"  (1.626 m)     Head Circumference --      Peak Flow --      Pain Score 10/17/16 1741 5     Pain Loc --      Pain Edu? --      Excl. in GC? --    No data found.   Updated Vital Signs BP (!) 150/93 (BP Location: Left Arm)   Pulse 79   Temp 97.5 F (36.4 C) (Oral)   Resp 16   Ht 5\' 4"  (1.626 m)   Wt 155 lb (70.3 kg)   SpO2 98%   BMI 26.61 kg/m   Visual Acuity Right Eye Distance:   Left Eye Distance:   Bilateral Distance:    Right Eye Near:   Left Eye Near:    Bilateral Near:     Physical Exam  Constitutional: She appears well-developed and well-nourished. No distress.  HENT:  Head: Normocephalic and atraumatic.  Right Ear: External ear and ear canal normal. Tympanic membrane is bulging. A middle ear effusion is present.  Left Ear: Tympanic membrane, external ear and ear canal normal.  Nose: Mucosal edema and rhinorrhea present. No nose lacerations, sinus tenderness, nasal deformity, septal deviation or nasal septal hematoma. No epistaxis.  No foreign bodies. Right sinus exhibits no maxillary sinus tenderness and no frontal sinus tenderness. Left sinus exhibits no maxillary sinus tenderness and no frontal sinus tenderness.  Mouth/Throat: Uvula is midline, oropharynx is clear and moist and mucous membranes are normal. No oropharyngeal exudate.  Eyes: Conjunctivae and EOM are normal. Pupils are equal, round, and reactive to light. Right eye exhibits no discharge. Left eye exhibits no discharge. No scleral icterus.  Neck: Normal range of motion. Neck supple. No thyromegaly present.  Cardiovascular: Normal rate, regular rhythm and normal heart sounds.   Pulmonary/Chest: Effort normal and breath sounds normal. No respiratory distress. She has no wheezes. She has no rales.  Lymphadenopathy:    She has no cervical adenopathy.  Skin:  She is not diaphoretic.  Nursing note and vitals reviewed.    UC Treatments / Results  Labs (all labs ordered are listed, but only abnormal results are displayed) Labs Reviewed - No data to display  EKG  EKG Interpretation None       Radiology No results found.  Procedures Procedures (including critical care time)  Medications Ordered in UC Medications - No data to display   Initial Impression / Assessment and Plan / UC Course  I have reviewed the triage vital signs and the  nursing notes.  Pertinent labs & imaging results that were available during my care of the patient were reviewed by me and considered in my medical decision making (see chart for details).  Clinical Course       Final Clinical Impressions(s) / UC Diagnoses   Final diagnoses:  Right acute serous otitis media, recurrence not specified  Cough    New Prescriptions Discharge Medication List as of 10/17/2016  5:38 PM    START taking these medications   Details  amoxicillin (AMOXIL) 875 MG tablet Take 1 tablet (875 mg total) by mouth 2 (two) times daily., Starting Wed 10/17/2016, Normal       1.diagnosis reviewed with patient/parent/guardian/family 2. rx as per orders above; reviewed possible side effects, interactions, risks and benefits  3. Recommend supportive treatment with rest, fluids 4. Follow-up prn if symptoms worsen or don't improve   Payton Mccallumrlando Kavontae Pritchard, MD 10/17/16 2117

## 2016-10-17 NOTE — ED Triage Notes (Signed)
Right ear pain, right facial pain, headache, productive cough- yellow, fever, body aches, x1 week.

## 2016-12-10 ENCOUNTER — Ambulatory Visit (INDEPENDENT_AMBULATORY_CARE_PROVIDER_SITE_OTHER): Payer: BLUE CROSS/BLUE SHIELD

## 2016-12-10 ENCOUNTER — Ambulatory Visit
Admission: EM | Admit: 2016-12-10 | Discharge: 2016-12-10 | Disposition: A | Discharge status: Home / Self Care | Payer: BLUE CROSS/BLUE SHIELD | Attending: Family Medicine | Admitting: Family Medicine

## 2016-12-10 DIAGNOSIS — S46912A Strain of unspecified muscle, fascia and tendon at shoulder and upper arm level, left arm, initial encounter: Secondary | ICD-10-CM | POA: Diagnosis not present

## 2016-12-10 DIAGNOSIS — M79641 Pain in right hand: Secondary | ICD-10-CM | POA: Diagnosis not present

## 2016-12-10 MED ORDER — HYDROCODONE-ACETAMINOPHEN 5-325 MG PO TABS: ORAL_TABLET | ORAL | 0 refills | Status: DC

## 2016-12-10 NOTE — ED Triage Notes (Signed)
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 She Is unable to lift it all the way and has pain in it when she lays on it.

## 2016-12-10 NOTE — ED Provider Notes (Signed)
[facility]    CSN: [id number] Arrival date & time: 12/10/16  1535     History   Chief Complaint Chief Complaint  Patient presents with  . Shoulder Injury    left shoulder    HPI Deborah Estrada is a 61 y.o. female.    Shoulder Injury   Shoulder Pain  Location:  Shoulder Shoulder location:  L shoulder Injury: yes   Time since incident:  1 month Mechanism of injury: fall   Fall:    Fall occurred:  Down stairs (missed step but did not land on shoulder or hit shoulder directly; patient held on to railing and felt/heard something pop and felt pain)   Entrapped after fall: no   Pain details:    Quality:  Aching Handedness:  Right-handed Dislocation: no   Foreign body present:  No foreign bodies Prior injury to area:  No Ineffective treatments:  Acetaminophen Associated symptoms: decreased range of motion   Associated symptoms: no numbness and no swelling   Risk factors: no known bone disorder and no frequent fractures     Past Medical History:  Diagnosis Date  . HBP (high blood pressure)     There are no active problems to display for this patient.   Past Surgical History:  Procedure Laterality Date  . CHOLECYSTECTOMY    . COLONOSCOPY      OB History    No data available       Home Medications    Prior to Admission medications   Medication Sig Start Date End Date Taking? Authorizing Provider  albuterol (PROVENTIL HFA;VENTOLIN HFA) 108 (90 Base) MCG/ACT inhaler Inhale 1-2 puffs into the lungs every 6 (six) hours as needed for wheezing or shortness of breath. Use with spacer 09/15/16  Yes Lutricia FeilWilliam P Roemer, PA-C  celecoxib (CELEBREX) 200 MG capsule Take 200 mg by mouth daily.   Yes Historical Provider, MD  diclofenac (VOLTAREN) 75 MG EC tablet Take 75 mg by mouth 2 (two) times daily.   Yes Historical Provider, MD  fexofenadine-pseudoephedrine (ALLEGRA-D ALLERGY & CONGESTION) 180-240 MG 24 hr tablet Take 1 tablet by mouth daily. 09/06/16   Yes Hassan RowanEugene Wade, MD  fluticasone (FLONASE) 50 MCG/ACT nasal spray Place 2 sprays into both nostrils daily. 09/06/16  Yes Hassan RowanEugene Wade, MD  meloxicam (MOBIC) 15 MG tablet Take 15 mg by mouth daily.   Yes Historical Provider, MD  HYDROcodone-acetaminophen (NORCO/VICODIN) 5-325 MG tablet 1-2 tabs po qd prn 12/10/16   Payton Mccallumrlando Jayleah Garbers, MD  Naproxen Sodium (NAPRELAN) 750 MG TB24 Take by mouth daily.    Historical Provider, MD    Family History Family History  Problem Relation Age of Onset  . Breast cancer Mother   . Heart attack Father     Social History Social History  Substance Use Topics  . Smoking status: Never Smoker  . Smokeless tobacco: Never Used  . Alcohol use Yes     Comment: social     Allergies   Patient has no known allergies.   Review of Systems Review of Systems   Physical Exam Triage Vital Signs ED Triage Vitals  Enc Vitals Group     BP 12/10/16 1553 136/68     Pulse Rate 12/10/16 1553 69     Resp 12/10/16 1553 18     Temp 12/10/16 1553 97.5 F (36.4 C)     Temp Source 12/10/16 1553 Oral     SpO2 12/10/16 1553 100 %     Weight 12/10/16 1550 155  lb (70.3 kg)     Height 12/10/16 1550 5\' 4"  (1.626 m)     Head Circumference --      Peak Flow --      Pain Score 12/10/16 1552 10     Pain Loc --      Pain Edu? --      Excl. in GC? --    No data found.   Updated Vital Signs BP 136/68 (BP Location: Right Arm)   Pulse 69   Temp 97.5 F (36.4 C) (Oral)   Resp 18   Ht 5\' 4"  (1.626 m)   Wt 155 lb (70.3 kg)   SpO2 100%   BMI 26.61 kg/m   Visual Acuity Right Eye Distance:   Left Eye Distance:   Bilateral Distance:    Right Eye Near:   Left Eye Near:    Bilateral Near:     Physical Exam  Constitutional: She appears well-developed and well-nourished. No distress.  Musculoskeletal:       Left shoulder: She exhibits decreased range of motion, tenderness and bony tenderness. She exhibits no swelling, no effusion, no crepitus, no deformity, no laceration,  no pain, no spasm, normal pulse and normal strength.  Skin: She is not diaphoretic.  Nursing note and vitals reviewed.    UC Treatments / Results  Labs (all labs ordered are listed, but only abnormal results are displayed) Labs Reviewed - No data to display  EKG  EKG Interpretation None       Radiology Dg Wrist Complete Left  Result Date: 12/10/2016 CLINICAL DATA:  61 year old who fell down approximately 2 stairs 1 month ago and grabbed the hand rail with her left arm. Persistent pain in the left shoulder and left wrist. Initial encounter. EXAM: LEFT WRIST - COMPLETE 3+ VIEW COMPARISON:  None. FINDINGS: No evidence of acute fracture or dislocation. Joint spaces well preserved. Well-preserved bone mineral density. No intrinsic osseous abnormalities. IMPRESSION: Normal examination. Electronically Signed   By: Hulan Saas M.D.   On: 12/10/2016 16:56   Dg Shoulder Left  Result Date: 12/10/2016 CLINICAL DATA:  61 year old who fell down approximately 2 stairs 1 month ago and grabbed the hand rail with her left arm. Persistent pain in the left shoulder and left wrist. Initial encounter. EXAM: LEFT SHOULDER - 2+ VIEW COMPARISON:  None. FINDINGS: No evidence of acute fracture or glenohumeral dislocation. Subacromial space well preserved. Acromioclavicular joint intact. Well preserved bone mineral density. No intrinsic osseous abnormality. IMPRESSION: Normal examination. Electronically Signed   By: Hulan Saas M.D.   On: 12/10/2016 16:55    Procedures Procedures (including critical care time)  Medications Ordered in UC Medications - No data to display   Initial Impression / Assessment and Plan / UC Course  I have reviewed the triage vital signs and the nursing notes.  Pertinent labs & imaging results that were available during my care of the patient were reviewed by me and considered in my medical decision making (see chart for details).       Final Clinical Impressions(s) /  UC Diagnoses   Final diagnoses:  Shoulder strain, left, initial encounter    New Prescriptions Discharge Medication List as of 12/10/2016  5:08 PM    START taking these medications   Details  HYDROcodone-acetaminophen (NORCO/VICODIN) 5-325 MG tablet 1-2 tabs po qd prn, Print       1. X-ray results (negative for fracture) and diagnosis reviewed with patient 2. rx as per orders above; reviewed possible side effects,  interactions, risks and benefits  3. Recommend supportive treatment with range of motion exercises 4. Follow-up prn if symptoms worsen or don't i Joellyn Rued, MD 12/10/16 1734

## 2016-12-26 ENCOUNTER — Other Ambulatory Visit: Payer: Self-pay | Admitting: Orthopedic Surgery

## 2016-12-26 DIAGNOSIS — S46002A Unspecified injury of muscle(s) and tendon(s) of the rotator cuff of left shoulder, initial encounter: Secondary | ICD-10-CM

## 2016-12-26 DIAGNOSIS — M25512 Pain in left shoulder: Secondary | ICD-10-CM

## 2016-12-26 DIAGNOSIS — G8929 Other chronic pain: Secondary | ICD-10-CM

## 2016-12-26 DIAGNOSIS — M25312 Other instability, left shoulder: Secondary | ICD-10-CM

## 2016-12-28 ENCOUNTER — Other Ambulatory Visit: Payer: Self-pay | Admitting: Unknown Physician Specialty

## 2016-12-28 DIAGNOSIS — M75102 Unspecified rotator cuff tear or rupture of left shoulder, not specified as traumatic: Secondary | ICD-10-CM

## 2017-01-08 ENCOUNTER — Ambulatory Visit
Admission: RE | Admit: 2017-01-08 | Discharge: 2017-01-08 | Disposition: A | Discharge status: Home / Self Care | Payer: BLUE CROSS/BLUE SHIELD | Source: Ambulatory Visit | Attending: Unknown Physician Specialty | Admitting: Unknown Physician Specialty

## 2017-01-08 DIAGNOSIS — M75102 Unspecified rotator cuff tear or rupture of left shoulder, not specified as traumatic: Secondary | ICD-10-CM

## 2017-03-07 ENCOUNTER — Ambulatory Visit
Admission: EM | Admit: 2017-03-07 | Discharge: 2017-03-07 | Disposition: A | Discharge status: Home / Self Care | Payer: BLUE CROSS/BLUE SHIELD | Attending: Family Medicine | Admitting: Family Medicine

## 2017-03-07 DIAGNOSIS — R05 Cough: Secondary | ICD-10-CM | POA: Diagnosis not present

## 2017-03-07 DIAGNOSIS — J069 Acute upper respiratory infection, unspecified: Secondary | ICD-10-CM | POA: Diagnosis not present

## 2017-03-07 MED ORDER — HYDROCOD POLST-CPM POLST ER 10-8 MG/5ML PO SUER: 5.0000 mL | Freq: Two times a day (BID) | ORAL | 0 refills | Status: DC

## 2017-03-07 MED ORDER — BENZONATATE 200 MG PO CAPS: ORAL_CAPSULE | ORAL | 0 refills | Status: DC

## 2017-03-07 MED ORDER — ALBUTEROL SULFATE HFA 108 (90 BASE) MCG/ACT IN AERS: 1.0000 | INHALATION_SPRAY | RESPIRATORY_TRACT | 0 refills | Status: DC | PRN

## 2017-03-07 MED ORDER — FLUTICASONE PROPIONATE 50 MCG/ACT NA SUSP: 2.0000 | Freq: Every day | NASAL | 0 refills | Status: DC

## 2017-03-07 NOTE — ED Provider Notes (Signed)
CSN: [id number]     Arrival date & time 03/07/17  1928 History   First MD Initiated Contact with Patient 03/07/17 2001     Chief Complaint  Patient presents with  . Cough  . Headache   (Consider location/radiation/quality/duration/timing/severity/associated sxs/prior Treatment) HPI  [name] who presents with not feeling well since Monday.3 days prior to this visit. She's had a headache and a constant cough. She states that she is productive of some brown sputum. She has felt feverish. She's had some shortness of breath. She has chest pain when she coughs and also the sore throat which is very bothersome. She is afebrile at 98.4 pulse rate of 84, blood pressure 122/79, respirations 18, and O2 sats 97% on room air       Past Medical History:  Diagnosis Date  . HBP (high blood pressure)    Past Surgical History:  Procedure Laterality Date  . CHOLECYSTECTOMY    . COLONOSCOPY     Family History  Problem Relation Age of Onset  . Breast cancer Mother   . Heart attack Father    Social History  Substance Use Topics  . Smoking status: Never Smoker  . Smokeless tobacco: Never Used  . Alcohol use Yes     Comment: social   OB History    No data available     Review of Systems  Constitutional: Positive for activity change and fatigue. Negative for chills and fever.  HENT: Positive for congestion, postnasal drip and sore throat.   Respiratory: Positive for cough and shortness of breath. Negative for wheezing and stridor.   All other systems reviewed and are negative.   Allergies  Patient has no known allergies.  Home Medications   Prior to Admission medications   Medication Sig Start Date End Date Taking? Authorizing Provider  diclofenac (VOLTAREN) 75 MG EC tablet Take 75 mg by mouth 2 (two) times daily.   Yes Historical Provider, MD  fexofenadine-pseudoephedrine (ALLEGRA-D ALLERGY & CONGESTION) 180-240 MG 24 hr tablet Take 1 tablet by mouth daily. 09/06/16   Yes Hassan RowanEugene Wade, MD  meloxicam (MOBIC) 15 MG tablet Take 15 mg by mouth daily.   Yes Historical Provider, MD  albuterol (PROVENTIL HFA;VENTOLIN HFA) 108 (90 Base) MCG/ACT inhaler Inhale 1-2 puffs into the lungs every 4 (four) hours as needed for wheezing or shortness of breath. Use with spacer 03/07/17   Lutricia FeilWilliam P Roemer, PA-C  benzonatate (TESSALON) 200 MG capsule Take one cap TID PRN cough 03/07/17   Lutricia FeilWilliam P Roemer, PA-C  chlorpheniramine-HYDROcodone [facility](TUSSIONEX PENNKINETIC ER) 10-8 MG/5ML SUER Take 5 mLs by mouth 2 (two) times daily. 03/07/17   Lutricia FeilWilliam P Roemer, PA-C  fluticasone (FLONASE) 50 MCG/ACT nasal spray Place 2 sprays into both nostrils daily. 03/07/17   Lutricia FeilWilliam P Roemer, PA-C   Meds Ordered and Administered this Visit  Medications - No data to display  BP 122/79 (BP Location: Left Arm)   Pulse 84   Temp 98.4 F (36.9 C) (Oral)   Resp 18   Ht 5\' 4"  (1.626 m)   Wt 158 lb (71.7 kg)   SpO2 97%   BMI 27.12 kg/m  No data found.   Physical Exam  Constitutional: She is oriented to person, place, and time. She appears well-developed and well-nourished. No distress.  HENT:  Head: Normocephalic.  Right Ear: External ear normal.  Left Ear: External ear normal.  Nose: Nose normal.  Mouth/Throat: Oropharynx is clear and moist.  Eyes: Pupils are equal, round,  and reactive to light. Right eye exhibits no discharge. Left eye exhibits no discharge.  Neck: Normal range of motion. Neck supple.  Pulmonary/Chest: Effort normal and breath sounds normal. No respiratory distress. She has no wheezes. She has no rales.  Musculoskeletal: Normal range of motion.  Lymphadenopathy:    She has no cervical adenopathy.  Neurological: She is alert and oriented to person, place, and time.  Skin: Skin is warm and dry. She is not diaphoretic.  Psychiatric: She has a normal mood and affect. Her behavior is normal. Judgment and thought content normal.  Nursing note and vitals reviewed.   Urgent Care Course      Procedures (including critical care time)  Labs Review Labs Reviewed - No data to display  Imaging Review No results found.   Visual Acuity Review  Right Eye Distance:   Left Eye Distance:   Bilateral Distance:    Right Eye Near:   Left Eye Near:    Bilateral Near:         MDM   1. Upper respiratory tract infection, unspecified type    Discharge Medication List as of 03/07/2017  8:17 PM    START taking these medications   Details  benzonatate (TESSALON) 200 MG capsule Take one cap TID PRN cough, Normal    chlorpheniramine-HYDROcodone (TUSSIONEX PENNKINETIC ER) 10-8 MG/5ML SUER Take 5 mLs by mouth 2 (two) times daily., Starting Thu 03/07/2017, Print      Plan: 1. Test/x-ray results and diagnosis reviewed with patient 2. rx as per orders; risks, benefits, potential side effects reviewed with patient 3. Recommend supportive treatment with Increase fluids and rest. Use albuterol for shortness of breath or wheezing. I told her this is likely a viral illness and does not require antibiotics at this time. However she continues to have the cough for another 2 weeks or if she develops high fevers increased sputum production she should come in sooner for reevaluation. States that she is in between primary care physicians. She is not a smoker and has not been around secondhand smoke since childhood. 4. F/u prn if symptoms worsen or don't improve     Lutricia Feil, PA-C 03/07/17 2026

## 2017-03-07 NOTE — ED Triage Notes (Signed)
Pt c/o not feeling well since Monday with a headache and a cough. This morning she swallowed a cough drop that felt like it got caught in her throat and then she drank some coffee and it seemed to go down.

## 2017-03-11 ENCOUNTER — Encounter: Payer: Self-pay | Admitting: Emergency Medicine

## 2017-03-11 ENCOUNTER — Ambulatory Visit
Admission: EM | Admit: 2017-03-11 | Discharge: 2017-03-11 | Disposition: A | Discharge status: Home / Self Care | Payer: BLUE CROSS/BLUE SHIELD | Attending: Family Medicine | Admitting: Family Medicine

## 2017-03-11 DIAGNOSIS — H6693 Otitis media, unspecified, bilateral: Secondary | ICD-10-CM | POA: Diagnosis not present

## 2017-03-11 DIAGNOSIS — J069 Acute upper respiratory infection, unspecified: Secondary | ICD-10-CM

## 2017-03-11 DIAGNOSIS — R05 Cough: Secondary | ICD-10-CM

## 2017-03-11 DIAGNOSIS — R059 Cough, unspecified: Secondary | ICD-10-CM

## 2017-03-11 LAB — RAPID STREP SCREEN (MED CTR MEBANE ONLY): Streptococcus, Group A Screen (Direct): NEGATIVE

## 2017-03-11 MED ORDER — GUAIFENESIN-CODEINE 100-10 MG/5ML PO SYRP: 10.0000 mL | ORAL_SOLUTION | ORAL | 0 refills | Status: DC | PRN

## 2017-03-11 MED ORDER — AMOXICILLIN-POT CLAVULANATE 875-125 MG PO TABS: 1.0000 | ORAL_TABLET | Freq: Two times a day (BID) | ORAL | 0 refills | Status: DC

## 2017-03-11 MED ORDER — FLUCONAZOLE 200 MG PO TABS: 200.0000 mg | ORAL_TABLET | ORAL | 0 refills | Status: DC

## 2017-03-11 NOTE — Discharge Instructions (Signed)
-   Augmentin. One tablet every 12 hours until completed. - Cheratussin cough syrup. 10ml every 4 hours as needed for cough. Do not exceed 60 ml in a 24 hour period - OTC Flonase nasal spray for congestion. Direct spray slightly towards ear. - OTC ibuprofen and Tylenol for pain - OTC throat spray for sore throat pain - Drink plenty of fluids and rest as able - Return to clinic should symptoms worsen or not improve.

## 2017-03-11 NOTE — ED Triage Notes (Signed)
Patient c/o cough and chest congestion that has not gotten better.  Patient also reports throat irritation.

## 2017-03-11 NOTE — ED Provider Notes (Signed)
CSN: [id number]     Arrival date & time 03/11/17  1649 History   None    Chief Complaint  Patient presents with  . Sore Throat  . Cough   (Consider location/radiation/quality/duration/timing/severity/associated sxs/prior Treatment) Patient is a 61 year old female with past history as noted below who presents to clinic complaining of sore throat and cough. Patient was seen here on May 3 with similar symptoms and stated that her symptoms have not improved. Patient given prescription of Tessalon, Tussionex with hydrocodone, and albuterol inhaler. Patient reports she is still coughing quite a bit despite the medications, continuing to cough up brown sputum. Patient reports her coughing has also been causing chest wall pain, headaches, and neck pains. 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 Patient denies any fever and chills for this presentation. Patient also denies sinus pressure does report port ear pain. Patient denies any chest pain, no pain, nausea, and vomiting. Patient denies any shortness of breath but states her coughing is fairly continuous.  Patient requesting a medication for cough that she can take every 4-6 hours instead of every 12 hours.      Past Medical History:  Diagnosis Date  . HBP (high blood pressure)    Past Surgical History:  Procedure Laterality Date  . CHOLECYSTECTOMY    . COLONOSCOPY     Family History  Problem Relation Age of Onset  . Breast cancer Mother   . Heart attack Father    Social History  Substance Use Topics  . Smoking status: Never Smoker  . Smokeless tobacco: Never Used  . Alcohol use Yes     Comment: social   OB History    No data available     Review of Systems  Constitutional: Negative for chills and fever.  HENT: Positive for congestion, ear pain and sore throat. Negative for sinus pressure.   Eyes: Negative.   Respiratory: Positive for cough (productive  of brown sputum). Negative for shortness of breath.        Chest wall and neck pain related to cough  Cardiovascular: Negative.   Gastrointestinal: Negative.   Genitourinary: Negative.   Neurological: Positive for headaches (related to cough).  All other systems reviewed and are negative.   Allergies  Patient has no known allergies.  Home Medications   Prior to Admission medications   Medication Sig Start Date End Date Taking? Authorizing Provider  albuterol (PROVENTIL HFA;VENTOLIN HFA) 108 (90 Base) MCG/ACT inhaler Inhale 1-2 puffs into the lungs every 4 (four) hours as needed for wheezing or shortness of breath. Use with spacer 03/07/17   Lutricia Feiloemer, William P, PA-C  amoxicillin-clavulanate (AUGMENTIN) 875-125 MG tablet Take 1 tablet by mouth every 12 (twelve) hours. 03/11/17   Candis SchatzHarris, Marvion Bastidas D, PA-C  benzonatate (TESSALON) 200 MG capsule Take one cap TID PRN cough 03/07/17   Lutricia Feiloemer, William P, PA-C  chlorpheniramine-HYDROcodone [facility](TUSSIONEX PENNKINETIC ER) 10-8 MG/5ML SUER Take 5 mLs by mouth 2 (two) times daily. 03/07/17   Lutricia Feiloemer, William P, PA-C  diclofenac (VOLTAREN) 75 MG EC tablet Take 75 mg by mouth 2 (two) times daily.    [provider]  fexofenadine-pseudoephedrine (ALLEGRA-D ALLERGY & CONGESTION) 180-240 MG 24 hr tablet Take 1 tablet by mouth daily. 09/06/16   Hassan RowanWade, Eugene, MD  fluconazole (DIFLUCAN) 200 MG tablet Take 1 tablet (200 mg total) by mouth as directed. Take one tablet now. May take additional tablet in seven days if symptoms  continue. 03/11/17   Candis Schatz, PA-C  fluticasone (FLONASE) 50 MCG/ACT nasal spray Place 2 sprays into both nostrils daily. 03/07/17   Lutricia Feil, PA-C  guaiFENesin-codeine (CHERATUSSIN AC) 100-10 MG/5ML syrup Take 10 mLs by mouth every 4 (four) hours as needed for cough. Do not exceed 60 ml in 24 hour period. 03/11/17   Candis Schatz, PA-C  meloxicam (MOBIC) 15 MG tablet Take 15 mg by mouth daily.    [provider]   Meds  Ordered and Administered this Visit  Medications - No data to display  BP 120/72 (BP Location: Right Arm)   Pulse 81   Temp 98.4 F (36.9 C) (Oral)   Resp 16   Ht 5\' 4"  (1.626 m)   Wt 147 lb (66.7 kg)   SpO2 98%   BMI 25.23 kg/m  No data found.   Physical Exam  Constitutional: She is oriented to person, place, and time. She appears well-developed and well-nourished.  HENT:  Head: Normocephalic and atraumatic.  Right Ear: Ear canal normal. Tympanic membrane is injected.  Left Ear: Ear canal normal. Tympanic membrane is injected. A middle ear effusion is present.  Small areas of redness to throat. Clear drainage.  Eyes: EOM are normal. Pupils are equal, round, and reactive to light.  Neck: Normal range of motion. Neck supple.  Cardiovascular: Normal rate, regular rhythm and normal heart sounds.   Pulmonary/Chest: Effort normal and breath sounds normal.  Abdominal: Soft. Bowel sounds are normal.  Musculoskeletal: Normal range of motion.  Lymphadenopathy:    She has cervical adenopathy.  Neurological: She is alert and oriented to person, place, and time.  Skin: Skin is warm and dry.  Psychiatric: She has a normal mood and affect. Her behavior is normal.    Urgent Care Course     Procedures (including critical care time)  Labs Review Labs Reviewed  RAPID STREP SCREEN (NOT AT [facility])  CULTURE, GROUP A STREP [facility])    Imaging Review No results found.  New Prescriptions   AMOXICILLIN-CLAVULANATE (AUGMENTIN) 875-125 MG TABLET    Take 1 tablet by mouth every 12 (twelve) hours.   FLUCONAZOLE (DIFLUCAN) 200 MG TABLET    Take 1 tablet (200 mg total) by mouth as directed. Take one tablet now. May take additional tablet in seven days if symptoms continue.   GUAIFENESIN-CODEINE (CHERATUSSIN AC) 100-10 MG/5ML SYRUP    Take 10 mLs by mouth every 4 (four) hours as needed for cough. Do not exceed 60 ml in 24 hour period.      MDM   1. Bilateral otitis media, unspecified otitis  media type   2. Upper respiratory tract infection, unspecified type   3. Cough    Patient presents to with the same symptoms that she had upon presentation on May 3. Patient reports continued cough with production of brown sputum. Patient reports only short duration improvement with the inhaler but states that she continues to cough despite the Tussionex and Tessalon. Patient with some fluid ears as well as injected TMs. Will prescribe a seven-day course of Augmentin and will add Cheratussin syrup that she can take every 4-6 hours as needed for cough. Pt has also been given a prescription for Diflucan due to a history of fungal infections after taking antibiotics in the past. Recommend patient continue the albuterol inhaler as needed and also recommend that she can try Flonase nasal spray to help clear up her nasal and ear congestion. Patient also advised to drink clear  fluids and rest as possible. Patient last return to clinic should her symptoms worsen or not improved. Patient verbalized understanding and is in agreement with plan.   Candis Schatz, PA-C    Candis Schatz, PA-C 03/11/17 [phone]

## 2017-03-14 LAB — CULTURE, GROUP A STREP (THRC)

## 2017-04-22 ENCOUNTER — Ambulatory Visit: Payer: Self-pay | Admitting: Podiatry

## 2017-06-11 ENCOUNTER — Encounter: Payer: Self-pay | Admitting: *Deleted

## 2017-06-11 ENCOUNTER — Ambulatory Visit
Admission: EM | Admit: 2017-06-11 | Discharge: 2017-06-11 | Disposition: A | Discharge status: Home / Self Care | Payer: BLUE CROSS/BLUE SHIELD | Attending: Emergency Medicine | Admitting: Emergency Medicine

## 2017-06-11 DIAGNOSIS — H9312 Tinnitus, left ear: Secondary | ICD-10-CM

## 2017-06-11 DIAGNOSIS — H9202 Otalgia, left ear: Secondary | ICD-10-CM | POA: Diagnosis not present

## 2017-06-11 NOTE — ED Triage Notes (Signed)
Left ear pain x several weeks, worse over the past few days. Denies drainage and fever.

## 2017-06-11 NOTE — Discharge Instructions (Signed)
I'm unsure as to the cause of your symptoms today, I would recommend following up with an ear nose throat specialist so that they can do further investigation and help you out. Also, follow up with one of the primary care physician's below for routine care.  Here is a list of primary care providers who are taking new patients:  Dr. Elizabeth Sauereanna Jones, Dr. Schuyler AmorWilliam Plonk [address] [place] [address] [phone]  Dr. Everlene OtherJayce Cook [address]  [place] [address]  [phone]  [facility] [place] [address]  [place] [address]  [phone]  [facility] [address]  [place], [place] [address] [phone]  [facility] [place] [address]  [phone] [place], [place] [address]  Here are clinics/ other resources who will see you if you do not have insurance. Some have certain criteria that you must meet. Call them and find out what they are:  [facility]: [address] [place], [place] [address] Phone: [phone] Hours: First and Third Saturdays of each Month, 9 a.m. - 1 p.m.  [facility]: [address], [place], [place] [address] Phone: [phone] Hours: Tuesday, 4 p.m. - 8 p.m. Thursday, 1 p.m. - 8 p.m. Wednesday, 9 a.m. - [facility]  [facility] [address], [place], [place] [address] Phone: [phone] Pharmacy Phone Number: [phone] Dental Phone Number: [phone] [facility] Insurance Help: [phone]  Dental Hours: Monday - Thursday, 8 a.m. - 6 p.m.  [facility] [address] [place], [place] [address] Phone: [phone] Pharmacy Phone Number: [phone] [facility] Insurance Help: [phone]  [facility] [address] [place], [place] [address] Phone: [phone] Pharmacy Phone Number: [phone] [facility] Insurance Help: [phone]  [facility] [address] [place], [place]  [address] Phone: [phone] [facility] Insurance Help: [phone]   [facility] [address] [place], [place] [address] Phone: [phone]  Go to www.goodrx.com to look up your medications. This will give you a list of where you can find your prescriptions at the most affordable prices. Or ask the pharmacist what the cash price is, or if they have any other discount programs available to help make your medication more affordable. This can be less expensive than what you would pay with insurance.

## 2017-06-11 NOTE — ED Provider Notes (Signed)
HPI  SUBJECTIVE:  Deborah Estrada is a 61 y.o. female who presents with several weeks of constant left-sided tinnitus described as humming which is worse at night. She denies ringing, shrieking, squealing noises. She also reports sharp intermittent seconds long "deep" left-sided ear pain. Symptoms are worse at night with lying down. Pain is not associated with anything that specifically yawning or chewing. She has not tried anything for this. No alleviating factors. No nasal congestion, rhinorrhea, postnasal drip. No fevers. No facial rash. She does grind her teeth and wears a mouth guard for this. She wears this on a regular basis. No recent swimming, foreign body insertion. No change in hearing, otorrhea. No sore throat, vertigo, exposure to loud noise. Past medical history of otitis media, right-sided TMJ, hypertension. No history of diabetes, Mnire's disease. PMD: None.    Past Medical History:  Diagnosis Date  . HBP (high blood pressure)     Past Surgical History:  Procedure Laterality Date  . CHOLECYSTECTOMY    . COLONOSCOPY      Family History  Problem Relation Age of Onset  . Breast cancer Mother   . Heart attack Father     Social History  Substance Use Topics  . Smoking status: Never Smoker  . Smokeless tobacco: Never Used  . Alcohol use Yes     Comment: social    No current facility-administered medications for this encounter.   Current Outpatient Prescriptions:  .  fluticasone (FLONASE) 50 MCG/ACT nasal spray, Place 2 sprays into both nostrils daily., Disp: 16 g, Rfl: 0 .  fexofenadine-pseudoephedrine (ALLEGRA-D ALLERGY & CONGESTION) 180-240 MG 24 hr tablet, Take 1 tablet by mouth daily., Disp: 30 tablet, Rfl: 0 .  meloxicam (MOBIC) 15 MG tablet, Take 15 mg by mouth daily., Disp: , Rfl:   No Known Allergies   ROS  As noted in HPI.   Physical Exam  BP 131/86 (BP Location: Left Arm)   Pulse 62   Temp 97.7 F (36.5 C) (Oral)   Resp 16   Ht 5\' 4"   (1.626 m)   SpO2 98%   Constitutional: Well developed, well nourished, no acute distress Eyes:  EOMI, conjunctiva normal bilaterally HENT: Normocephalic, atraumatic,mucus membranes moist, left external ear normal. No tenderness over the mastoid. No pain with traction on pinna or palpation of tragus. Left external ear canal normal. Left TM normal. No air-fluid levels seen. No tenderness at the TMJ. No popping, crepitus. Right TM normal. Dentition normal, nontender. Lymph: No cervical lymphadenopathy Respiratory: Normal inspiratory effort Cardiovascular: Normal rate GI: nondistended skin: No facial rash, skin intact Musculoskeletal: no deformities Neurologic: Alert & oriented x 3, no focal neuro deficits Psychiatric: Speech and behavior appropriate   ED Course   Medications - No data to display  No orders of the defined types were placed in this encounter.   No results found for this or any previous visit (from the past 24 hour(s)). No results found.  ED Clinical Impression  Tinnitus of left ear  Otalgia of left ear   ED Assessment/Plan  Previous records reviewed. Patient was seen here back in May for bilateral otitis media.  Unsure as to cause this patient's tinnitus and otalgia, so have referred to Dr. Andee PolesVaught, ENT on call for further evaluation. She does not have an otitis externa, otitis media, mastoiditis, TMJ  inflammation, or dental infection.   Also provided primary care referral list for routine care.  Discussed  MDM, plan and followup with patient. Discussed sn/sx that should  prompt return to the ED. Patient agrees with plan.   No orders of the defined types were placed in this encounter.   *This clinic note was created using Dragon dictation software. Therefore, there may be occasional mistakes despite careful proofreading.  ?   Domenick Gong, MD 06/11/17 [phone]

## 2017-06-24 ENCOUNTER — Encounter: Payer: Self-pay | Admitting: Podiatry

## 2017-06-24 ENCOUNTER — Ambulatory Visit (INDEPENDENT_AMBULATORY_CARE_PROVIDER_SITE_OTHER): Payer: BLUE CROSS/BLUE SHIELD | Admitting: Podiatry

## 2017-06-24 ENCOUNTER — Ambulatory Visit (INDEPENDENT_AMBULATORY_CARE_PROVIDER_SITE_OTHER): Payer: BLUE CROSS/BLUE SHIELD

## 2017-06-24 DIAGNOSIS — Z780 Asymptomatic menopausal state: Secondary | ICD-10-CM | POA: Insufficient documentation

## 2017-06-24 DIAGNOSIS — M779 Enthesopathy, unspecified: Secondary | ICD-10-CM | POA: Diagnosis not present

## 2017-06-24 DIAGNOSIS — M7752 Other enthesopathy of left foot: Secondary | ICD-10-CM | POA: Diagnosis not present

## 2017-06-24 DIAGNOSIS — N83209 Unspecified ovarian cyst, unspecified side: Secondary | ICD-10-CM | POA: Insufficient documentation

## 2017-06-24 DIAGNOSIS — K5900 Constipation, unspecified: Secondary | ICD-10-CM | POA: Insufficient documentation

## 2017-06-24 MED ORDER — MELOXICAM 15 MG PO TABS: 15.0000 mg | ORAL_TABLET | Freq: Every day | ORAL | 3 refills | Status: DC

## 2017-06-24 MED ORDER — METHYLPREDNISOLONE 4 MG PO TBPK: ORAL_TABLET | ORAL | 0 refills | Status: DC

## 2017-06-24 NOTE — Progress Notes (Signed)
   Subjective:    Patient ID: Deborah Estrada, female    DOB: Jul 18, 1956, 61 y.o.   MRN: [id number]  HPI: She presents today chief complaint chief complaint of burning and pain and spasms second and third metatarsophalangeal joints of the left foot. Since his pain going on now for the past few months. Denies any injuries she's tried foot creating cool foot. She states sometimes she gets sharp shooting sensations at the end of the day after work she can barely walk. She states that she saw another doctor recently put her in a flat shoe.  Review of Systems  HENT: Positive for tinnitus.   Neurological: Positive for dizziness.  All other systems reviewed and are negative.      Objective:   Physical Exam: Vital signs are stable she is alert and oriented 3. Pulses are palpable. Neurologic sensorium is intact. Degenerative flexors are intact. Muscle strength is intact bilateral symmetrical. Orthopedic evaluation shows also systems ankle range of motion but crepitation. She is pain on dorsiflexion and is particularly plantar flexion of the second and third metatarsophalangeals of the left foot with mild overlying erythema. Radiographs taken today do not demonstrate any Fractures surgeries. Cutaneous evaluation and suture supple hydrated cutis no erythema edema cellulitis drainage or odor.        Assessment & Plan:  Capsulitis of the forefoot left #2 #3.  Plan: Injected the area today with dexamethasone Kenalog and local anesthetic. Started on a Medrol Dosepak to be followed by meloxicam. Recommended that she continue to wear that flat shoe.

## 2017-07-10 ENCOUNTER — Ambulatory Visit (INDEPENDENT_AMBULATORY_CARE_PROVIDER_SITE_OTHER): Payer: BLUE CROSS/BLUE SHIELD | Admitting: Orthotics

## 2017-07-10 DIAGNOSIS — M779 Enthesopathy, unspecified: Secondary | ICD-10-CM

## 2017-07-10 NOTE — Progress Notes (Signed)
Patient came in today for evaluation/casting CMFO per Dr. Milinda Pointer.  Patient presents with pes cavus foot type and tenderness upon palpation 2/3 met heads L.   Plan on Richy to fab semi-rigid device w/ deep heel cup/hug arch with 2/3rd met head relieve and bil med met pads.   Patient advised of 398 responsibility if [employer] does not pay.

## 2017-07-22 ENCOUNTER — Ambulatory Visit: Payer: BLUE CROSS/BLUE SHIELD | Admitting: Podiatry

## 2017-07-29 ENCOUNTER — Ambulatory Visit (INDEPENDENT_AMBULATORY_CARE_PROVIDER_SITE_OTHER): Payer: BLUE CROSS/BLUE SHIELD | Admitting: Podiatry

## 2017-07-29 ENCOUNTER — Encounter: Payer: Self-pay | Admitting: Podiatry

## 2017-07-29 DIAGNOSIS — M779 Enthesopathy, unspecified: Secondary | ICD-10-CM | POA: Diagnosis not present

## 2017-07-29 NOTE — Patient Instructions (Signed)

## 2017-07-30 NOTE — Progress Notes (Signed)
She presents today to pick up her orthotics in for follow-up of her capsulitis of the second third digits of the left foot. She states that the toes are feeling much better and she no longer has pain at the level of the metatarsophalangeal joints.  Objective: Vital signs are stable alert and oriented 3. Pulses are palpable with no calf pain. She has no pain on palpation or range of motion of the second or third metatarsophalangeal joints of the left foot.  Assessment: At this point she is pain-free with capsulitis.   Plan: Distributed orthotics today with literature on 1 instructions on how to care for him to wear her orthotics. She has had orthotics in the past base of these feel little different and the left one in particular is more abnormal to her. I encouraged her to try to wear this how she does with that and I will follow-up with her in 1 month otherwise she will follow-up with Raiford Noble for correction of the orthotic if necessary.

## 2018-03-10 ENCOUNTER — Encounter: Payer: Self-pay | Admitting: Podiatry

## 2018-03-10 ENCOUNTER — Encounter

## 2018-03-10 ENCOUNTER — Ambulatory Visit: Payer: BLUE CROSS/BLUE SHIELD | Admitting: Podiatry

## 2018-03-10 DIAGNOSIS — M778 Other enthesopathies, not elsewhere classified: Secondary | ICD-10-CM

## 2018-03-10 DIAGNOSIS — M7750 Other enthesopathy of unspecified foot: Secondary | ICD-10-CM

## 2018-03-10 DIAGNOSIS — M779 Enthesopathy, unspecified: Principal | ICD-10-CM

## 2018-03-10 MED ORDER — METHYLPREDNISOLONE 4 MG PO TBPK: ORAL_TABLET | ORAL | 0 refills | Status: DC

## 2018-03-11 NOTE — Progress Notes (Signed)
She presents today for follow-up of her capsulitis second metatarsophalangeal joints bilaterally.  She states that the orthotics do not seem to be working very well with this pad in the middle she states that the pad really hurts impresses upon her too much.  And she does not feel that there is enough of a divot to help offload her second metatarsophalangeal joints like in her previous orthotics.  Objective: Vital signs are stable alert and oriented x3.  Pulses are palpable.  Neurologic sensorium is intact.  Deep tendon reflexes are intact.  Muscle strength is normal symmetrical.  She has severe pain on palpation and range of motion of the second metatarsal phalangeal joint of the right foot.  There is moderate edema surrounding the area left greater than right.  Assessment: Capsulitis second metatarsophalangeal joint.  Plan: Discussed etiology pathology conservative versus surgical therapies.  We will send the orthotics back have the metatarsal pad removed and a divot placed in both orthotics.  These divots will be located beneath the second metatarsal phalangeal joint to help offload the joint and I injected the area today with 2 mg of dexamethasone and local anesthetic.  She tolerated procedure well after alcohol prep and I will follow-up with her once these orthotics are in.

## 2018-03-13 ENCOUNTER — Telehealth: Payer: Self-pay | Admitting: Podiatry

## 2018-03-13 NOTE — Telephone Encounter (Signed)
Patient is still complaining of her left still swollen after she received shots from Dr. Al Corpus. She is wondering what else can she do for her pain and swelling. She is in pain all day. Please give her a call before this weekend.

## 2018-03-13 NOTE — Telephone Encounter (Signed)
I returned patients call, she stated that her foot is hurting as bad as when she first came into the office.  She stated that she is on day 2 of her steroid dose pack, but she has been on her feet all day because she has to work.  I educated her on the importance of resting her foot as much as she can right now, informed her to try soaking in Epson salt twice daily and using frozen bottle of water to roll her foot on.  I also educated her that it may take several days for the steroid injections and dose pack to start working, but she also needs to rest her foot as much as she can.  I told her that if it is no better by tomorrow, she can call the office back.   She verbally understood

## 2018-03-17 ENCOUNTER — Ambulatory Visit: Payer: BLUE CROSS/BLUE SHIELD | Admitting: Podiatry

## 2018-04-01 ENCOUNTER — Ambulatory Visit: Payer: BLUE CROSS/BLUE SHIELD | Admitting: Podiatry

## 2018-04-01 ENCOUNTER — Encounter: Payer: Self-pay | Admitting: Podiatry

## 2018-04-01 ENCOUNTER — Ambulatory Visit (INDEPENDENT_AMBULATORY_CARE_PROVIDER_SITE_OTHER): Payer: BLUE CROSS/BLUE SHIELD

## 2018-04-01 DIAGNOSIS — M778 Other enthesopathies, not elsewhere classified: Secondary | ICD-10-CM

## 2018-04-01 DIAGNOSIS — M779 Enthesopathy, unspecified: Secondary | ICD-10-CM

## 2018-04-01 MED ORDER — CYCLOBENZAPRINE HCL 10 MG PO TABS: 10.0000 mg | ORAL_TABLET | Freq: Three times a day (TID) | ORAL | 0 refills | Status: DC | PRN

## 2018-04-01 MED ORDER — GABAPENTIN 300 MG PO CAPS: 300.0000 mg | ORAL_CAPSULE | Freq: Every day | ORAL | 3 refills | Status: DC

## 2018-04-01 NOTE — Progress Notes (Signed)
Presents she presents today stating that she is doing much worse than she was previously.  She states that she cannot even bear weight on her left foot any longer and she started developed some spasms and pains the bottom of her foot that she can hardly stand on it.  She states that the right one seems to be doing okay but the left was absolutely killing her.  She is requesting narcotics or something to help her rest or something to help with the pain.  She is requesting further steroid injections.  Objective: Vital signs are stable she is alert and oriented x3.  Pulses are palpable.  The foot appears to be more edematous today and have a purplish she right along the second and third metatarsophalangeal joints.  There is exquisitely tender on palpation including the first intermetatarsal space second intermetatarsal space and the third metatarsal itself.  Radiographs were taken today demonstrating an area between the first and second metatarsals that appears to be thicker and more discolored or hyperintense.  It appears to be lobulated and extends the entire length of the interspace.  This very well could be fluid but with the lobulation am concerned that this may be a soft tissue mass this resulting in her pain as well.  It is obvious that she has capsulitis of the second and third metatarsophalangeal joints with pain on the range of motion.  Was questionable as the fact that she did not feel any better after the Medrol Dosepak.  Some wondering at this time as this is associated with nerve or possibly something with her back as well.  Assessment: Capsulitis cannot rule out soft tissue mass second third metatarsal phalangeal joints and first intermetatarsal space.  Plan: Start her on 300 mg of gabapentin taken at nighttime and wrote a prescription for cyclobenzaprine.  Requesting MRI of the forefoot for capsulitis evaluation and possible mass.  She does not want to take IV contrast I recommended this.  I will  follow-up with her in the near future for surgical intervention [place] very least to discuss options.

## 2018-04-02 ENCOUNTER — Telehealth: Payer: Self-pay | Admitting: *Deleted

## 2018-04-02 ENCOUNTER — Ambulatory Visit: Payer: BLUE CROSS/BLUE SHIELD | Admitting: Podiatry

## 2018-04-02 DIAGNOSIS — M778 Other enthesopathies, not elsewhere classified: Secondary | ICD-10-CM

## 2018-04-02 DIAGNOSIS — M779 Enthesopathy, unspecified: Principal | ICD-10-CM

## 2018-04-02 NOTE — Telephone Encounter (Signed)
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

## 2018-04-02 NOTE — Telephone Encounter (Signed)
Pt states she would like to go to [facility] was not happy with [facility] last time. Orders to [facility] for pre-cert, and faxed to [facility].

## 2018-04-07 ENCOUNTER — Telehealth: Payer: Self-pay | Admitting: Podiatry

## 2018-04-07 DIAGNOSIS — M778 Other enthesopathies, not elsewhere classified: Secondary | ICD-10-CM

## 2018-04-07 DIAGNOSIS — M779 Enthesopathy, unspecified: Principal | ICD-10-CM

## 2018-04-07 NOTE — Telephone Encounter (Signed)
Patient called and wanted to know about the MRI she's suppose to have soon. Haven't heard anything about scheduling an appointment. If you can call patient back at [phone] is cheaper- [facility]

## 2018-04-08 NOTE — Telephone Encounter (Signed)
I informed pt of [facility] [phone] and informed she could call for the appt. [facility].

## 2018-04-08 NOTE — Telephone Encounter (Signed)
[facility] - JOCELYN CHANGED MRI LEFT FOOT WO CONTRAST TO [facility] [address], ORDER NUMBER REMAINS [id number].

## 2018-04-08 NOTE — Addendum Note (Signed)
Addended by: Alphia Kava'CONNELL, VALERY D on: 04/08/2018 10:25 AM   Modules accepted: Orders

## 2018-04-14 ENCOUNTER — Ambulatory Visit: Payer: BLUE CROSS/BLUE SHIELD | Admitting: Podiatry

## 2018-04-15 ENCOUNTER — Inpatient Hospital Stay: Admission: RE | Admit: 2018-04-15 | Payer: BLUE CROSS/BLUE SHIELD | Source: Ambulatory Visit

## 2018-04-17 ENCOUNTER — Ambulatory Visit: Payer: BLUE CROSS/BLUE SHIELD | Admitting: Podiatry

## 2018-04-17 ENCOUNTER — Encounter: Payer: Self-pay | Admitting: Podiatry

## 2018-04-17 DIAGNOSIS — M779 Enthesopathy, unspecified: Secondary | ICD-10-CM

## 2018-04-17 DIAGNOSIS — M778 Other enthesopathies, not elsewhere classified: Secondary | ICD-10-CM

## 2018-04-17 NOTE — Progress Notes (Signed)
She presents today states that my feet hurt so bad I could not afford the MRI my insurance does not cover it at all and the gabapentin did not help at all but the Flexeril did help some.  She states that she is disappointed that we are unable to help her with her problem permanently  Objective: Vital signs are stable she is alert and oriented x3 pulses are palpable.  She has severe pain on palpation to the second interdigital space at the level of metatarsophalangeal joints there is splaying of the toes and his foot appears to be a soft tissue tumor.  I expressed to her that without an MRI and without surgical intervention there is no way to get rid of this 100% hard to even know what were dealing with 100% certainty.  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  Plan: Injected the area today utilizing 10 mg of Kenalog 5 mg Marcaine to the point of maximal tenderness resulting in tears.  I expressed to her that I need this MRI and the surgery is the only way to fix his foot.

## 2018-05-20 ENCOUNTER — Ambulatory Visit: Payer: BLUE CROSS/BLUE SHIELD | Admitting: Podiatry

## 2018-05-24 DIAGNOSIS — E669 Obesity, unspecified: Secondary | ICD-10-CM | POA: Insufficient documentation

## 2018-05-24 DIAGNOSIS — Z6835 Body mass index (BMI) 35.0-35.9, adult: Secondary | ICD-10-CM

## 2018-05-24 DIAGNOSIS — Z79899 Other long term (current) drug therapy: Secondary | ICD-10-CM | POA: Insufficient documentation

## 2018-05-30 DIAGNOSIS — J0100 Acute maxillary sinusitis, unspecified: Secondary | ICD-10-CM | POA: Insufficient documentation

## 2018-05-30 DIAGNOSIS — H1032 Unspecified acute conjunctivitis, left eye: Secondary | ICD-10-CM | POA: Insufficient documentation

## 2018-10-24 ENCOUNTER — Other Ambulatory Visit: Payer: BLUE CROSS/BLUE SHIELD | Admitting: Orthotics

## 2018-11-04 ENCOUNTER — Encounter: Payer: Self-pay | Admitting: Podiatry

## 2018-11-04 ENCOUNTER — Encounter

## 2018-11-04 ENCOUNTER — Ambulatory Visit: Payer: BLUE CROSS/BLUE SHIELD | Admitting: Podiatry

## 2018-11-04 DIAGNOSIS — M7662 Achilles tendinitis, left leg: Secondary | ICD-10-CM

## 2018-11-04 DIAGNOSIS — M779 Enthesopathy, unspecified: Secondary | ICD-10-CM

## 2018-11-04 DIAGNOSIS — M7661 Achilles tendinitis, right leg: Secondary | ICD-10-CM | POA: Diagnosis not present

## 2018-11-04 DIAGNOSIS — M778 Other enthesopathies, not elsewhere classified: Secondary | ICD-10-CM

## 2018-11-04 NOTE — Progress Notes (Signed)
She presents today chief complaint of pain to the retrocalcaneal area.  She is also complaining of pain to the second metatarsophalangeal joints bilaterally.  She states that she can barely lay her heels down on her bed because of the pain that she is experiencing.  Objective: Vital signs are stable she is alert and oriented x3.  Pulses are palpable.  She has a small palpable bursa on the posterior aspect of the bilateral heels which we injected today with dexamethasone.  She also has pain on palpation and end range of motion of the second metatarsal phalangeal joints bilaterally.  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  Plan: Discussed etiology pathology and surgical therapies at this point injected 20 mg Kenalog 5 mg Marcaine divided 2injections to the second metatarsophalangeal joints bilaterally and 2 mg of dexamethasone local anesthetic to the bilateral heel both after sterile Betadine skin prep.  Tolerated procedure well without complications.  Follow-up with her as needed.

## 2018-11-11 ENCOUNTER — Ambulatory Visit: Payer: BLUE CROSS/BLUE SHIELD | Admitting: Orthotics

## 2018-11-11 DIAGNOSIS — M779 Enthesopathy, unspecified: Principal | ICD-10-CM

## 2018-11-11 DIAGNOSIS — M7661 Achilles tendinitis, right leg: Secondary | ICD-10-CM

## 2018-11-11 DIAGNOSIS — M7662 Achilles tendinitis, left leg: Secondary | ICD-10-CM

## 2018-11-11 DIAGNOSIS — M778 Other enthesopathies, not elsewhere classified: Secondary | ICD-10-CM

## 2018-11-11 NOTE — Progress Notes (Signed)
Lowering arch, changing to cover, removing padding to be able to get into shoes.  Ship to [employer]

## 2018-11-19 ENCOUNTER — Ambulatory Visit: Payer: BLUE CROSS/BLUE SHIELD | Admitting: Podiatry

## 2018-12-02 ENCOUNTER — Ambulatory Visit: Payer: BLUE CROSS/BLUE SHIELD | Admitting: Podiatry

## 2018-12-23 ENCOUNTER — Ambulatory Visit: Payer: BLUE CROSS/BLUE SHIELD | Admitting: Orthotics

## 2018-12-23 DIAGNOSIS — M779 Enthesopathy, unspecified: Principal | ICD-10-CM

## 2018-12-23 DIAGNOSIS — M7661 Achilles tendinitis, right leg: Secondary | ICD-10-CM

## 2018-12-23 DIAGNOSIS — M7662 Achilles tendinitis, left leg: Secondary | ICD-10-CM

## 2018-12-23 DIAGNOSIS — M778 Other enthesopathies, not elsewhere classified: Secondary | ICD-10-CM

## 2018-12-23 NOTE — Progress Notes (Signed)
Adjusted f/o to fit in shoes.

## 2019-04-21 ENCOUNTER — Ambulatory Visit: Payer: Self-pay | Admitting: Podiatry

## 2019-05-06 ENCOUNTER — Ambulatory Visit (INDEPENDENT_AMBULATORY_CARE_PROVIDER_SITE_OTHER): Payer: Medicare HMO

## 2019-05-06 ENCOUNTER — Ambulatory Visit (INDEPENDENT_AMBULATORY_CARE_PROVIDER_SITE_OTHER): Payer: Medicare HMO | Admitting: Podiatry

## 2019-05-06 ENCOUNTER — Ambulatory Visit: Payer: Medicare HMO

## 2019-05-06 ENCOUNTER — Encounter: Payer: Self-pay | Admitting: Podiatry

## 2019-05-06 VITALS — Temp 98.0°F

## 2019-05-06 DIAGNOSIS — M778 Other enthesopathies, not elsewhere classified: Secondary | ICD-10-CM

## 2019-05-06 DIAGNOSIS — M779 Enthesopathy, unspecified: Secondary | ICD-10-CM

## 2019-05-06 NOTE — Progress Notes (Signed)
She presents today complaining of pain to the second metatarsal phalangeal joint of the left foot.  She states that she cannot afford to do surgery unless she can get a ride back to work on her surgical foot immediately.  Objective: Vital signs are stable she is alert and oriented x3 she has pain on palpation the second metatarsal phalangeal joint and pain on range of motion.  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  Plan: Injected a very light dose of dexamethasone around the joint with local anesthetic.  She tolerated procedure well follow-up with her in a few months.

## 2019-05-11 ENCOUNTER — Telehealth: Payer: Self-pay

## 2019-05-11 DIAGNOSIS — M778 Other enthesopathies, not elsewhere classified: Secondary | ICD-10-CM

## 2019-05-11 MED ORDER — NONFORMULARY OR COMPOUNDED ITEM: 3 refills | Status: AC

## 2019-05-11 NOTE — Telephone Encounter (Signed)
Patient called and was wanting to know if you wanted to go ahead and order an MRI of her left foot.  She stated that you had mentioned an MRI at her visit back in December last year, but the injection given last week did not help.  You did not mention anything about MRI in your last office note.  She also wanted to know if there were any changes in her x-rays from May last year to last weeks x-rays.  Please advise.  Thanks!

## 2019-05-19 NOTE — Telephone Encounter (Signed)
You can order MRI  Get dx from chart.

## 2019-06-04 NOTE — Telephone Encounter (Signed)
I called [employer], was informed Per Celia P. That patient insurance has termed.   Tried to contact patient on cell #, voice mail is full, unable to leave message on her phone, contacted spouse, left message for patient to call office back to discuss insurance and MRI.

## 2019-06-08 NOTE — Addendum Note (Signed)
Addended by: Graceann Congress D on: 06/08/2019 04:30 PM   Modules accepted: Orders

## 2019-06-08 NOTE — Telephone Encounter (Signed)
Per [employer] [facility] approved from 06/08/2019 to 12/05/2019 Auth # [id number]

## 2019-06-24 ENCOUNTER — Telehealth: Payer: Self-pay | Admitting: Podiatry

## 2019-06-24 NOTE — Telephone Encounter (Signed)
Pt wants to confirm if she is supposed to get MRI at [facility] before scheduling her MRI with them.

## 2019-06-24 NOTE — Telephone Encounter (Signed)
I spoke with patient regarding MRI.  She is going to call around to find out the cheapest imaging center and call me back and let me know.

## 2019-06-25 ENCOUNTER — Telehealth: Payer: Self-pay | Admitting: Podiatry

## 2019-06-25 DIAGNOSIS — M19079 Primary osteoarthritis, unspecified ankle and foot: Secondary | ICD-10-CM

## 2019-06-25 NOTE — Telephone Encounter (Signed)
Pt is calling about her Mri/ she spoke with insurance yesterday. She was asked to call the office today.

## 2019-06-25 NOTE — Telephone Encounter (Signed)
Pt states she spoke with her insurance and she is going to have the MRI at [facility]. I told pt is would inform the nurse in [place] and she would perform the pre-cert.

## 2019-06-25 NOTE — Addendum Note (Signed)
Addended by: Graceann Congress D on: 06/25/2019 04:39 PM   Modules accepted: Orders

## 2019-07-18 ENCOUNTER — Other Ambulatory Visit: Payer: Self-pay

## 2019-07-18 ENCOUNTER — Ambulatory Visit
Admission: RE | Admit: 2019-07-18 | Discharge: 2019-07-18 | Disposition: A | Discharge status: Home / Self Care | Payer: BLUE CROSS/BLUE SHIELD | Source: Ambulatory Visit | Attending: Podiatry | Admitting: Podiatry

## 2019-07-18 DIAGNOSIS — M19079 Primary osteoarthritis, unspecified ankle and foot: Secondary | ICD-10-CM

## 2019-07-21 ENCOUNTER — Telehealth: Payer: Self-pay | Admitting: *Deleted

## 2019-07-21 NOTE — Telephone Encounter (Signed)
Unable to contact pt to discuss results, mail box is full.

## 2019-07-22 ENCOUNTER — Other Ambulatory Visit: Payer: BLUE CROSS/BLUE SHIELD

## 2019-07-23 ENCOUNTER — Telehealth: Payer: Self-pay | Admitting: *Deleted

## 2019-07-23 NOTE — Telephone Encounter (Signed)
Contacted pt to make an appt.

## 2019-07-23 NOTE — Telephone Encounter (Signed)
I informed pt of Dr. Stephenie Acres review of results and recommendations. Pt states understanding and transferred to scheduler.

## 2019-07-23 NOTE — Telephone Encounter (Signed)
-----   Message from Max T Hyatt, DPM sent at 07/21/2019  7:45 AM EDT ----- Capsular tear of the joint.  Needs surgery.  Have her in if she so desires. 

## 2019-07-23 NOTE — Telephone Encounter (Signed)
-----   Message from Garrel Ridgel, [place] sent at 07/21/2019  7:45 AM EDT ----- Capsular tear of the joint.  Needs surgery.  Have her in if she so desires.

## 2019-08-05 ENCOUNTER — Other Ambulatory Visit: Payer: Self-pay

## 2019-08-05 ENCOUNTER — Encounter: Payer: Self-pay | Admitting: Podiatry

## 2019-08-05 ENCOUNTER — Ambulatory Visit (INDEPENDENT_AMBULATORY_CARE_PROVIDER_SITE_OTHER): Payer: Medicare HMO | Admitting: Podiatry

## 2019-08-05 DIAGNOSIS — M779 Enthesopathy, unspecified: Secondary | ICD-10-CM

## 2019-08-05 DIAGNOSIS — M778 Other enthesopathies, not elsewhere classified: Secondary | ICD-10-CM

## 2019-08-05 NOTE — Progress Notes (Signed)
She presents today for surgical consult regarding her second and third metatarsal phalangeal joints of her left foot.  I spoke with her today as well as her daughter Lenna Sciara who was on the phone.  We also went over the MRI.  Objective: Vital signs are stable alert and oriented x3.  Pulses are palpable.  We went over the MRI report which basically states that she has a tear plantar plate second and third metatarsal phalangeal joints.  Assessment: Plantarflexed elongated second and third metatarsals with tearing the plantar plates.  Plan: Discussed etiology pathology conservative surgical therapies at this point consented her today for second and third metatarsal osteotomy with pinning of the toes.  Repair of the plantar plate.  She understands this and is amenable to it.  We did discuss the possible postop complications which may include but not limited to postop pain bleeding swelling infection recurrence need for further surgery overcorrection under correction.  Provided her with information regarding the surgery center anesthesia group.  Dispensed a Cam walker.

## 2019-08-05 NOTE — Patient Instructions (Signed)
Pre-Operative Instructions  Congratulations, you have decided to take an important step towards improving your quality of life.  You can be assured that the doctors and staff at [facility] will be with you every step of the way.  Here are some important things you should know:  1. Plan to be at the surgery center/hospital at least 1 (one) hour prior to your scheduled time, unless otherwise directed by the surgical center/hospital staff.  You must have a responsible adult accompany you, remain during the surgery and drive you home.  Make sure you have directions to the surgical center/hospital to ensure you arrive on time. 2. If you are having surgery at [facility] or [facility], you will need a copy of your medical history and physical form from your family physician within one month prior to the date of surgery. We will give you a form for your primary physician to complete.  3. We make every effort to accommodate the date you request for surgery.  However, there are times where surgery dates or times have to be moved.  We will contact you as soon as possible if a change in schedule is required.   4. No aspirin/ibuprofen for one week before surgery.  If you are on aspirin, any non-steroidal anti-inflammatory medications (Mobic, Aleve, Ibuprofen) should not be taken seven (7) days prior to your surgery.  You make take Tylenol for pain prior to surgery.  5. Medications - If you are taking daily heart and blood pressure medications, seizure, reflux, allergy, asthma, anxiety, pain or diabetes medications, make sure you notify the surgery center/hospital before the day of surgery so they can tell you which medications you should take or avoid the day of surgery. 6. No food or drink after midnight the night before surgery unless directed otherwise by surgical center/hospital staff. 7. No alcoholic beverages 24-hours prior to surgery.  No smoking 24-hours prior or 24-hours after  surgery. 8. Wear loose pants or shorts. They should be loose enough to fit over bandages, boots, and casts. 9. Don't wear slip-on shoes. Sneakers are preferred. 10. Bring your boot with you to the surgery center/hospital.  Also bring crutches or a walker if your physician has prescribed it for you.  If you do not have this equipment, it will be provided for you after surgery. 11. If you have not been contacted by the surgery center/hospital by the day before your surgery, call to confirm the date and time of your surgery. 12. Leave-time from work may vary depending on the type of surgery you have.  Appropriate arrangements should be made prior to surgery with your employer. 13. Prescriptions will be provided immediately following surgery by your doctor.  Fill these as soon as possible after surgery and take the medication as directed. Pain medications will not be refilled on weekends and must be approved by the doctor. 14. Remove nail polish on the operative foot and avoid getting pedicures prior to surgery. 15. Wash the night before surgery.  The night before surgery wash the foot and leg well with water and the antibacterial soap provided. Be sure to pay special attention to beneath the toenails and in between the toes.  Wash for at least three (3) minutes. Rinse thoroughly with water and dry well with a towel.  Perform this wash unless told not to do so by your physician.  Enclosed: 1 Ice pack (please put in freezer the night before surgery)   1 Hibiclens skin cleaner     Pre-op instructions  If you have any questions regarding the instructions, please do not hesitate to call our office.  [place]: [address], [place], [place] [address] -- [phone]  [place]: [address] [place], [place] [address] -- [phone]  [place]: [address]  [place], [place] [address] -- [phone]   Website: [email or web address] 

## 2019-11-27 ENCOUNTER — Telehealth: Payer: Self-pay | Admitting: Podiatry

## 2019-11-27 NOTE — Telephone Encounter (Signed)
Called pt letting her know I got her message and saw where she has a sx consult appt scheduled with Dr. Al Corpus on 12/09/19. I told her they would have her sign her consent forms at that appointment and if she wanted to go ahead and schedule a date for sx that day, they would do that as well. I explained we couldn't give a firm date before having her sign her consent forms.

## 2019-11-27 NOTE — Telephone Encounter (Signed)
I wanting to see about scheduling my sx on my foot with Dr. Al Corpus. If someone could give me a call back. Thanks.

## 2019-12-01 DIAGNOSIS — F411 Generalized anxiety disorder: Secondary | ICD-10-CM | POA: Insufficient documentation

## 2019-12-01 DIAGNOSIS — G8929 Other chronic pain: Secondary | ICD-10-CM | POA: Insufficient documentation

## 2019-12-01 DIAGNOSIS — Z803 Family history of malignant neoplasm of breast: Secondary | ICD-10-CM | POA: Insufficient documentation

## 2019-12-03 DIAGNOSIS — D696 Thrombocytopenia, unspecified: Secondary | ICD-10-CM | POA: Insufficient documentation

## 2019-12-09 ENCOUNTER — Ambulatory Visit: Payer: Medicare HMO | Admitting: Podiatry

## 2019-12-09 ENCOUNTER — Other Ambulatory Visit: Payer: Self-pay

## 2019-12-09 ENCOUNTER — Telehealth: Payer: Self-pay | Admitting: Podiatry

## 2019-12-09 ENCOUNTER — Ambulatory Visit (INDEPENDENT_AMBULATORY_CARE_PROVIDER_SITE_OTHER): Payer: Medicare HMO

## 2019-12-09 ENCOUNTER — Encounter: Payer: Self-pay | Admitting: Podiatry

## 2019-12-09 DIAGNOSIS — M7752 Other enthesopathy of left foot: Secondary | ICD-10-CM | POA: Diagnosis not present

## 2019-12-09 DIAGNOSIS — M778 Other enthesopathies, not elsewhere classified: Secondary | ICD-10-CM

## 2019-12-09 NOTE — Telephone Encounter (Signed)
Tried calling pt about her appt today. Unable to leave a voicemail as her mailbox is full.

## 2019-12-09 NOTE — Progress Notes (Signed)
She presents today states I think my need a shot my foot is still very sore I know no one will be able to have surgery yet but I would also like to wear my boot to work.  Objective: Vital signs are stable she is alert and oriented x3 he has pain on palpation second and third metatarsophalangeal joints of the left foot.  Radiographs taken today demonstrate no change in deformity of the second and third metatarsophalangeal joints.  Minimal edema to the plantar aspect of the forefoot.  She has good range of motion but tender on palpation.  Assessment: Metatarsalgia with capsulitis of the second and third metatarsophalangeal joints bursitis plantarly.  Plan: Discussed etiology pathology conservative surgical therapies at this point I encouraged her to make sure she gets her platelet count under control which has been low recently.  I also encouraged injections to the second and third metatarsophalangeal joints of the left foot.  Also encouraged her to wear her cam walker and wrote a note for work.  I will follow-up with her in the near future.

## 2019-12-09 NOTE — Telephone Encounter (Signed)
Tried calling pt back and still unable to reach her. Mailbox is full so no voicemail's can be left.

## 2019-12-18 ENCOUNTER — Telehealth: Payer: Self-pay | Admitting: *Deleted

## 2019-12-18 NOTE — Telephone Encounter (Signed)
Patient is Concerned if it should be ok to take her Covid vaccine on 12/21/19. She recently had 2 injections in foot 1 week ago on Wednesday.

## 2019-12-21 NOTE — Telephone Encounter (Signed)
That will be fine. 

## 2020-03-09 ENCOUNTER — Encounter: Payer: Self-pay | Admitting: Podiatry

## 2020-03-09 ENCOUNTER — Ambulatory Visit: Payer: Medicare HMO | Admitting: Podiatry

## 2020-03-09 ENCOUNTER — Other Ambulatory Visit: Payer: Self-pay

## 2020-03-09 DIAGNOSIS — M7752 Other enthesopathy of left foot: Secondary | ICD-10-CM | POA: Diagnosis not present

## 2020-03-09 DIAGNOSIS — M778 Other enthesopathies, not elsewhere classified: Secondary | ICD-10-CM

## 2020-03-09 NOTE — Progress Notes (Signed)
She presents today to get another set of injections to the second and third metatarsophalangeal joints of the left foot.  At this point we did once again discuss surgery with her.  Objective: Vital signs are stable she is alert and oriented x3 there is no erythema edema cellulitis drainage or odor she has pain to palpation and end range of motion of the second third metatarsophalangeal joints.  Assessment: Chronic capsulitis second and third metatarsophalangeal joints left.  Plan: Discussed surgery with her once again today injected 2 mg of dexamethasone to each joint.  I will follow-up with her as needed

## 2020-05-24 ENCOUNTER — Other Ambulatory Visit: Payer: Self-pay | Admitting: Orthopedic Surgery

## 2020-05-24 DIAGNOSIS — M5030 Other cervical disc degeneration, unspecified cervical region: Secondary | ICD-10-CM

## 2020-05-24 DIAGNOSIS — M4722 Other spondylosis with radiculopathy, cervical region: Secondary | ICD-10-CM

## 2020-05-24 DIAGNOSIS — M5412 Radiculopathy, cervical region: Secondary | ICD-10-CM

## 2020-06-18 ENCOUNTER — Ambulatory Visit
Admission: RE | Admit: 2020-06-18 | Discharge: 2020-06-18 | Disposition: A | Discharge status: Home / Self Care | Payer: Medicare HMO | Source: Ambulatory Visit | Attending: Orthopedic Surgery | Admitting: Orthopedic Surgery

## 2020-06-18 DIAGNOSIS — M5412 Radiculopathy, cervical region: Secondary | ICD-10-CM

## 2020-06-18 DIAGNOSIS — M5030 Other cervical disc degeneration, unspecified cervical region: Secondary | ICD-10-CM

## 2020-06-18 DIAGNOSIS — M4722 Other spondylosis with radiculopathy, cervical region: Secondary | ICD-10-CM

## 2020-06-27 ENCOUNTER — Telehealth: Payer: Self-pay | Admitting: Podiatry

## 2020-06-27 NOTE — Telephone Encounter (Signed)
PT called inquiring about a scooter for her surgery Friday. She said she called Friday but has not heard anything. Please follow up.

## 2020-06-27 NOTE — Telephone Encounter (Signed)
Can one of you please order this knee scooter? I can send the log in information for [facility] for it ti be ordered if you need it. Thanks.

## 2020-06-28 ENCOUNTER — Telehealth: Payer: Self-pay | Admitting: Podiatry

## 2020-06-28 NOTE — Telephone Encounter (Signed)
DOS: 06/30/2020  Metatarsal Osteotomy 2nd & 3rd Lt [phone])  [employer] Medicare Effective: 11/06/2019 -  Deductible: $0 Out of Pocket: $5,000 with $419.75 met and $4,580.25 remaining CoInsurance: 100% Copay: $200  Per De [employer] no prior authorization is required. Call Ref# [id number]

## 2020-06-30 ENCOUNTER — Other Ambulatory Visit: Payer: Self-pay | Admitting: Podiatry

## 2020-06-30 MED ORDER — ONDANSETRON HCL 4 MG PO TABS: 4.0000 mg | ORAL_TABLET | Freq: Three times a day (TID) | ORAL | 0 refills | Status: AC | PRN

## 2020-06-30 MED ORDER — OXYCODONE-ACETAMINOPHEN 10-325 MG PO TABS: 1.0000 | ORAL_TABLET | Freq: Three times a day (TID) | ORAL | 0 refills | Status: AC | PRN

## 2020-06-30 MED ORDER — CEPHALEXIN 500 MG PO CAPS
500.0000 mg | ORAL_CAPSULE | Freq: Three times a day (TID) | ORAL | 0 refills | Status: DC
Start: 2020-06-30 — End: 2020-07-13

## 2020-07-01 DIAGNOSIS — M21542 Acquired clubfoot, left foot: Secondary | ICD-10-CM | POA: Diagnosis not present

## 2020-07-01 NOTE — Telephone Encounter (Signed)
The scooter was ordered.

## 2020-07-04 ENCOUNTER — Telehealth: Payer: Self-pay | Admitting: Podiatry

## 2020-07-04 NOTE — Telephone Encounter (Signed)
Patient had sx on Friday, patient is still in severe pain. Sx foot is throbbing. Patient would like to know what she needs to do. Please call asap

## 2020-07-05 ENCOUNTER — Telehealth: Payer: Self-pay

## 2020-07-05 NOTE — Telephone Encounter (Signed)
Patient called back and left message on machine that she was still having a lot of pain in her foot.  I called and spoke with her and instructed her to take boot off and loosen ace bandage as recommended by Dr. Al Corpus to see if that will give some relief.  I also instructed her to keep using ice and elevate foot as much as she can and we will take bandage off at tomorrows office visit.   She verbalized understanding and instructions

## 2020-07-05 NOTE — Telephone Encounter (Signed)
She could see doc today in [place] or remove boot and loosen ace bandage and redress.

## 2020-07-05 NOTE — Telephone Encounter (Signed)
Pt has an appointment with you tomorrow at 2:15pm.

## 2020-07-06 ENCOUNTER — Other Ambulatory Visit: Payer: Self-pay

## 2020-07-06 ENCOUNTER — Encounter: Payer: Self-pay | Admitting: Podiatry

## 2020-07-06 ENCOUNTER — Ambulatory Visit: Payer: Medicare HMO

## 2020-07-06 ENCOUNTER — Ambulatory Visit (INDEPENDENT_AMBULATORY_CARE_PROVIDER_SITE_OTHER): Payer: Medicare HMO | Admitting: Podiatry

## 2020-07-06 VITALS — BP 127/80 | HR 72

## 2020-07-06 DIAGNOSIS — M778 Other enthesopathies, not elsewhere classified: Secondary | ICD-10-CM

## 2020-07-06 DIAGNOSIS — Z9889 Other specified postprocedural states: Secondary | ICD-10-CM

## 2020-07-06 MED ORDER — PROMETHAZINE HCL 25 MG PO TABS: 25.0000 mg | ORAL_TABLET | Freq: Three times a day (TID) | ORAL | 0 refills | Status: AC | PRN

## 2020-07-06 MED ORDER — HYDROCODONE-ACETAMINOPHEN 10-325 MG PO TABS: 1.0000 | ORAL_TABLET | Freq: Four times a day (QID) | ORAL | 0 refills | Status: AC | PRN

## 2020-07-06 NOTE — Progress Notes (Signed)
She presents today 1 week status post second and third metatarsal osteotomies she states that these have been bothersome and she presents today with a knee scooter.  States that the pain is been severe and she has been very nauseous the entire time.  Objective: Vital signs are stable she is alert oriented x3 pulses are palpable dry sterile dressing was removed demonstrates considerable amount of bleeding with moderate erythema beneath the surgical site.  Radiographs taken today demonstrate internal fixation is in good position.  Assessment: Well-healing surgical foot though painful.  Plan: Redressed today dressed a compressive dressing follow-up with her in  weeks make sure she is healing well.  I sent in Vicodin and promethazine for her.  We will follow-up with her earlier if needed be.

## 2020-07-13 ENCOUNTER — Ambulatory Visit (INDEPENDENT_AMBULATORY_CARE_PROVIDER_SITE_OTHER): Payer: Medicare HMO | Admitting: Podiatry

## 2020-07-13 ENCOUNTER — Telehealth: Payer: Self-pay | Admitting: Podiatry

## 2020-07-13 ENCOUNTER — Encounter: Payer: Self-pay | Admitting: Podiatry

## 2020-07-13 ENCOUNTER — Other Ambulatory Visit: Payer: Self-pay

## 2020-07-13 DIAGNOSIS — M21962 Unspecified acquired deformity of left lower leg: Secondary | ICD-10-CM | POA: Diagnosis not present

## 2020-07-13 DIAGNOSIS — Z9889 Other specified postprocedural states: Secondary | ICD-10-CM

## 2020-07-13 MED ORDER — HYDROMORPHONE HCL 4 MG PO TABS: 4.0000 mg | ORAL_TABLET | ORAL | 0 refills | Status: DC | PRN

## 2020-07-13 NOTE — Telephone Encounter (Signed)
Meds were sent in at 4:21 - informed patient of this.

## 2020-07-13 NOTE — Telephone Encounter (Signed)
Informed patient she did not have to sleep in the shoe

## 2020-07-13 NOTE — Telephone Encounter (Signed)
Pt wanted to know if she had to sleep in her shoe or can she take it off to sleep?

## 2020-07-13 NOTE — Telephone Encounter (Signed)
Pt was calling to follow up about medication being sent to her pharmacy. [facility] [place] [address]

## 2020-07-13 NOTE — Progress Notes (Signed)
She presents today 2 weeks status post second and third metatarsal osteotomies left foot.  States that is still little sore.  She states that she is tired to sit at home listen to her husband fuss at her and she is ready to get back to work.  She states that she has some constipation because of all the pain medicine she had to take however her bowels are now moving and she is feeling better.  She goes on to say that she is can go back to work no matter what she started staying at home have never not worked and I am ready to get back tomorrow I know we discussed not going to work but I cannot stand it any longer and I will go back to work.  Objective: Vital signs are stable alert oriented x3 pulses palp.  Sutures are intact margins well coapted but decrease in edema minimal erythema no cellulitis drainage or odor.  Sutures are intact we went ahead and remove them today margins remain well coapted.  Assessment: Well-healing surgical foot.  Plan: I switched her from the cam walker to the [employer] shoe and placed her in a compression anklet for daytime use only I did refill her pain medication for Dilaudid and hopefully this will help alleviate her symptoms.  Other than this I did express to her in no uncertain terms that she should not go back to work she states that she is going to go no matter what so I told her that we had to document that and she does not care.  I will follow-up with her in 2 weeks for reevaluation.

## 2020-07-27 ENCOUNTER — Telehealth: Payer: Self-pay

## 2020-07-27 ENCOUNTER — Encounter: Payer: Medicare HMO | Admitting: Podiatry

## 2020-07-27 NOTE — Telephone Encounter (Signed)
Patient of Dr. Al Corpus called today stating that she is still having severe pain and swelling in her surgical foot, wakes her up at night throbbing, and soaking in epson salt, elevating and icing is not helping.  She said the Percocet usually helps her to be able to sleep.  She is seeing you next Thursday.  Can you send her in a script of Percocet to [facility] [place].  Please advise  Thanks!

## 2020-07-28 MED ORDER — OXYCODONE-ACETAMINOPHEN 10-325 MG PO TABS: 1.0000 | ORAL_TABLET | ORAL | 0 refills | Status: DC | PRN

## 2020-07-28 NOTE — Telephone Encounter (Signed)
Patient notified of medication sent to pharmacy

## 2020-07-28 NOTE — Addendum Note (Signed)
Addended by: Nicholes Rough on: 07/28/2020 05:23 AM   Modules accepted: Orders

## 2020-07-28 NOTE — Telephone Encounter (Signed)
done

## 2020-08-04 ENCOUNTER — Other Ambulatory Visit: Payer: Self-pay

## 2020-08-04 ENCOUNTER — Ambulatory Visit (INDEPENDENT_AMBULATORY_CARE_PROVIDER_SITE_OTHER): Payer: Medicare HMO | Admitting: Podiatry

## 2020-08-04 ENCOUNTER — Encounter: Payer: Self-pay | Admitting: Podiatry

## 2020-08-04 ENCOUNTER — Ambulatory Visit (INDEPENDENT_AMBULATORY_CARE_PROVIDER_SITE_OTHER): Payer: Medicare HMO

## 2020-08-04 DIAGNOSIS — M21962 Unspecified acquired deformity of left lower leg: Secondary | ICD-10-CM

## 2020-08-04 DIAGNOSIS — Z9889 Other specified postprocedural states: Secondary | ICD-10-CM

## 2020-08-04 MED ORDER — OXYCODONE-ACETAMINOPHEN 10-325 MG PO TABS: 1.0000 | ORAL_TABLET | ORAL | 0 refills | Status: DC | PRN

## 2020-08-04 NOTE — Progress Notes (Signed)
Subjective:  Patient ID: Deborah Estrada, female    DOB: 31-Aug-1956,  MRN: [id number]  Chief Complaint  Patient presents with  . Routine Post Op     DOS 07/01/20 METATARSAL OSTEOTOMY 2,3 LT  "it still hurts really bad, my toes have spasms and worse at night and my left ankle is hurting and swelling"    64 y.o. female returns for post-op check.  Patient states she is doing well.  She is does have some swelling and some spasm at night which does go away during the day.  She denies any other acute complaints.  She still has some pain she has run out of her pain medication she would like some more.  She is known to Dr. Al Corpus.  Review of Systems: Negative except as noted in the HPI. Denies N/V/F/Ch.  Past Medical History:  Diagnosis Date  . HBP (high blood pressure)     Current Outpatient Medications:  .  albuterol (PROVENTIL HFA;VENTOLIN HFA) 108 (90 Base) MCG/ACT inhaler, Inhale into the lungs., Disp: , Rfl:  .  Cholecalciferol (VITAMIN D) 2000 units tablet, Take by mouth., Disp: , Rfl:  .  cyclobenzaprine (FLEXERIL) 10 MG tablet, Take 1 tablet (10 mg total) by mouth 3 (three) times daily as needed for muscle spasms., Disp: 30 tablet, Rfl: 0 .  DULoxetine (CYMBALTA) 30 MG capsule, Take by mouth., Disp: , Rfl:  .  fluticasone (FLONASE) 50 MCG/ACT nasal spray, SHAKE LQ AND U 2 SPRAYS IEN D, Disp: , Rfl: 0 .  gabapentin (NEURONTIN) 300 MG capsule, Take 1 capsule (300 mg total) by mouth at bedtime., Disp: 30 capsule, Rfl: 3 .  HYDROmorphone (DILAUDID) 4 MG tablet, Take 1 tablet (4 mg total) by mouth every 4 (four) hours as needed., Disp: 30 tablet, Rfl: 0 .  LORazepam (ATIVAN) 0.5 MG tablet, lorazepam 0.5 mg tablet  TAKE 1 TABLET BY MOUTH TWICE A DAY AS NEEDED, Disp: , Rfl:  .  meclizine (ANTIVERT) 25 MG tablet, , Disp: , Rfl: 0 .  meloxicam (MOBIC) 15 MG tablet, Take 1 tablet (15 mg total) by mouth daily., Disp: 30 tablet, Rfl: 3 .  NONFORMULARY OR COMPOUNDED ITEM, [facility]  #17 Pain Cream 30 gm pump Apply 1-2 pumps 3-4 x daily 3 refills Sent in 05-07-19, Disp: , Rfl:  .  NONFORMULARY OR COMPOUNDED ITEM, Apply 2-3 pumps 3-4 times daily as needed, Disp: 120 each, Rfl: 3 .  ondansetron (ZOFRAN) 4 MG tablet, Take 1 tablet (4 mg total) by mouth every 8 (eight) hours as needed., Disp: 20 tablet, Rfl: 0 .  ondansetron (ZOFRAN-ODT) 4 MG disintegrating tablet, Take 4 mg by mouth every 8 (eight) hours as needed., Disp: , Rfl:  .  oxyCODONE-acetaminophen (PERCOCET) 10-325 MG tablet, Take 1 tablet by mouth every 4 (four) hours as needed for pain., Disp: 20 tablet, Rfl: 0 .  promethazine (PHENERGAN) 25 MG tablet, Take 1 tablet (25 mg total) by mouth every 8 (eight) hours as needed., Disp: 20 tablet, Rfl: 0 .  triamterene-hydrochlorothiazide (MAXZIDE-25) 37.5-25 MG tablet, TK 1 T PO QD, Disp: , Rfl: 0  Social History   Tobacco Use  Smoking Status Never Smoker  Smokeless Tobacco Never Used    Allergies  Allergen Reactions  . No Known Allergies    Objective:  There were no vitals filed for this visit. There is no height or weight on file to calculate BMI. Constitutional Well developed. Well nourished.  Vascular Foot warm and well perfused. Capillary refill normal  to all digits.   Neurologic Normal speech. Oriented to person, place, and time. Epicritic sensation to light touch grossly present bilaterally.  Dermatologic  skin completely reepithelialized.  Good correction alignment noted.  Orthopedic:  Mild tenderness to palpation noted about the surgical site.   Radiographs: 3 views of skeletally mature the left foot: Good correction alignment noted.  Metatarsal parabola is intact.  Hardware is intact no sign of loosening or backing noted. Assessment:   1. S/P foot surgery, left   2. Metatarsal deformity, left    Plan:  Patient was evaluated and treated and all questions answered.  S/p foot surgery left -Progressing as expected post-operatively. -XR: See  above -WB Status: Weightbearing as tolerated in surgical shoe. -Sutures: None. -Medications: Percocet -Foot redressed.  No follow-ups on file.

## 2020-08-10 ENCOUNTER — Encounter: Payer: Medicare HMO | Admitting: Podiatry

## 2020-08-17 ENCOUNTER — Encounter: Payer: Self-pay | Admitting: Podiatry

## 2020-08-17 ENCOUNTER — Other Ambulatory Visit: Payer: Self-pay

## 2020-08-17 ENCOUNTER — Ambulatory Visit (INDEPENDENT_AMBULATORY_CARE_PROVIDER_SITE_OTHER): Payer: Medicare HMO

## 2020-08-17 ENCOUNTER — Ambulatory Visit (INDEPENDENT_AMBULATORY_CARE_PROVIDER_SITE_OTHER): Payer: Medicare HMO | Admitting: Podiatry

## 2020-08-17 DIAGNOSIS — M21962 Unspecified acquired deformity of left lower leg: Secondary | ICD-10-CM

## 2020-08-17 DIAGNOSIS — Z9889 Other specified postprocedural states: Secondary | ICD-10-CM

## 2020-08-17 MED ORDER — GABAPENTIN 100 MG PO CAPS: 100.0000 mg | ORAL_CAPSULE | Freq: Every day | ORAL | 3 refills | Status: DC

## 2020-08-17 MED ORDER — OXYCODONE-ACETAMINOPHEN 10-325 MG PO TABS: 1.0000 | ORAL_TABLET | ORAL | 0 refills | Status: DC | PRN

## 2020-08-17 MED ORDER — CYCLOBENZAPRINE HCL 10 MG PO TABS
10.0000 mg | ORAL_TABLET | Freq: Three times a day (TID) | ORAL | 0 refills | Status: DC | PRN
Start: 2020-08-17 — End: 2020-09-21

## 2020-08-17 NOTE — Progress Notes (Signed)
She presents today for follow-up of her metatarsal osteotomy July 01 2020-second and third left.  She states that it really is not any better.  She still wears her Darco shoe still states that the skin is tender to touch dorsally.  Objective: Vital signs are stable she alert oriented x3 much decrease in edema incision sites healing very nicely still has a lot of hard scar tissue around the second third metatarsophalangeal joints with limited range of motion of those toes.  Assessment: Capsulitis resolved status post surgery but still retained surgical pain.  Plan: Discussed etiology pathology and surgical therapies this point in time discussed topical anti-inflammatories started her on cyclobenzaprine for the cramps and also refilled her pain medication.  Follow-up with her in about 6 weeks offered her physical therapy but she declined.  I expressed to her the importance of physical therapy she is still declined stating that she cannot afford it.

## 2020-08-31 ENCOUNTER — Ambulatory Visit: Payer: Medicare HMO | Admitting: Podiatry

## 2020-09-13 ENCOUNTER — Telehealth: Payer: Self-pay

## 2020-09-13 ENCOUNTER — Other Ambulatory Visit: Payer: Self-pay | Admitting: Podiatry

## 2020-09-13 MED ORDER — OXYCODONE-ACETAMINOPHEN 10-325 MG PO TABS: 1.0000 | ORAL_TABLET | Freq: Three times a day (TID) | ORAL | 0 refills | Status: AC | PRN

## 2020-09-13 NOTE — Telephone Encounter (Signed)
Patient called stating that she is still in a lot of pain with her foot, her incision burns with her shoes on, she has 2 knots on her arch and she can't hardly walk.  "Im in more pain than before my surgery now and I can't sleep at night"   She is requesting a refill of her oxycodone.  Her follow up appt with you is 09/21/20.  Please advise

## 2020-09-13 NOTE — Telephone Encounter (Signed)
Needs appt with Logan Bores or McDonald

## 2020-09-13 NOTE — Telephone Encounter (Signed)
Please just refer to mcdonald or evans so she could have another opinion.

## 2020-09-21 ENCOUNTER — Encounter: Payer: Self-pay | Admitting: Podiatry

## 2020-09-21 ENCOUNTER — Ambulatory Visit: Payer: Medicare HMO | Admitting: Podiatry

## 2020-09-21 ENCOUNTER — Other Ambulatory Visit: Payer: Self-pay

## 2020-09-21 ENCOUNTER — Ambulatory Visit (INDEPENDENT_AMBULATORY_CARE_PROVIDER_SITE_OTHER): Payer: Medicare HMO

## 2020-09-21 ENCOUNTER — Ambulatory Visit (INDEPENDENT_AMBULATORY_CARE_PROVIDER_SITE_OTHER): Payer: Medicare HMO | Admitting: Podiatry

## 2020-09-21 DIAGNOSIS — M21962 Unspecified acquired deformity of left lower leg: Secondary | ICD-10-CM

## 2020-09-21 DIAGNOSIS — G8928 Other chronic postprocedural pain: Secondary | ICD-10-CM

## 2020-09-21 DIAGNOSIS — Z9889 Other specified postprocedural states: Secondary | ICD-10-CM

## 2020-09-21 MED ORDER — GABAPENTIN 300 MG PO CAPS
300.0000 mg | ORAL_CAPSULE | Freq: Three times a day (TID) | ORAL | 3 refills | Status: AC
Start: 2020-09-21 — End: ?

## 2020-09-21 MED ORDER — CYCLOBENZAPRINE HCL 10 MG PO TABS: 10.0000 mg | ORAL_TABLET | Freq: Three times a day (TID) | ORAL | 0 refills | Status: DC | PRN

## 2020-09-21 MED ORDER — LIDOCAINE-PRILOCAINE 2.5-2.5 % EX CREA
1.0000 | TOPICAL_CREAM | CUTANEOUS | 2 refills | Status: DC | PRN
Start: 2020-09-21 — End: 2020-11-02

## 2020-09-21 NOTE — Progress Notes (Signed)
Subjective:  Patient ID: Deborah Estrada, female    DOB: 01/27/1956,  MRN: [id number]  Chief Complaint  Patient presents with  . Foot Pain    "My foot still hurts all the time and I can't hardly walk with the knots on my arch"    DOS: 07/01/20 Procedure: Left foot second and third metatarsal Weil osteotomies  64 y.o. female presents for evaluation for second opinion referral from Dr. Al Corpus following left foot surgery in August 2021.  She states that she is having significant pain on the top of the foot and in the arch and she has these painful masses along her plantar fascia now.  She states that the Percocet has been helping but is no longer helpful and feels like she needs something stronger.  Cyclobenzaprine has been helpful as well.  She was on gabapentin 100 mg briefly but is no longer taking it.  She describes burning tingling at the top of the foot.  She notes temperature changes and differences with water when bathing.  She is weightbearing in a regular shoe today.  She has not been going to physical therapy despite Dr. Al Corpus having recommended this to her.  She states "I do not understand what they are going to do that I cannot do myself and is only going to hurt me more."  Review of Systems: Negative except as noted in the HPI. Denies N/V/F/Ch.   Objective:  There were no vitals filed for this visit. There is no height or weight on file to calculate BMI. Constitutional Well developed. Well nourished.  Vascular Foot warm and well perfused. Capillary refill normal to all digits.   Neurologic Normal speech. Oriented to person, place, and time. Epicritic sensation to light touch grossly present bilaterally.  Dermatologic  her incision is well-healed.  The scar is not hypertrophic.  It is sensitive to touch.  There is warmth about the area.  She exhibits some signs of hyperpathia  Orthopedic:  Stiff range of motion of the MTPJ's.  Firm nonmobile masses in the medial band of the  plantar fascia that are tender to touch.   Radiographs: 3 weightbearing views of the left foot are taken, she has good alignment of the metatarsal parabola, the screws are in good position and the osteotomy sites have healed Assessment:   1. Metatarsal deformity, left   2. Chronic post-operative pain   3. Status post left foot surgery    Plan:  Patient was evaluated and treated and all questions answered.  S/p foot surgery left  I had a long discussion with Deborah Estrada today regarding her postoperative course.  I think that she has behind the ball and on schedule on rehabilitating the foot and that physical therapy would greatly improve her condition.  I think most of her symptoms are hypersensitivity along her incision and possible stiffness secondary to scar tissue formation about the surgical sites.    She states she really does not want to go to physical therapy because she is scared that they were going to hurt her more.  I strongly recommended this today and she agreed that she would consider going in physical therapy was given to her.  She is going to go to someone that her sister-in-law had recommended in [place].  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  Discussed that he is a difficult to treat and that injection therapy could help.  I do not think surgical excision would be a good idea for her given her difficulty with the surgery.  A significant amount of her pain does appear to be chronic in nature and she has a history of chronic spine problems and I think these are likely exacerbating it.  She does have some symptoms of hyperpathia and hyperalgesia along the incision and very light touch appears to make her flinch.  She subjectively reports that she has had issues consistent with bathing and that some  things often feel very hot or very cold with water on the foot.  While I think it is highly unlikely that this is the case, I do think it would be prudent to rule out the diagnosis of CRPS type II, and I have written a referral to her to the [facility] to evaluate for this.  Again, if this is something that is in the picture then physical therapy would be the best treatment choice for her.   I discussed with her that I would not begin prescribing her narcotic pain medications.  I will discuss with Dr. Al Corpus if he would like to renew this or not.  I recommended that she try other nonnarcotic multimodal analgesia with the following regimen:  -Topical treatment 3-4 times daily both with lidocaine prilocaine cream, which I prescribed as well as Voltaren gel which she can get over-the-counter. -Gabapentin 300 mg 3 times daily.  The use and side effects discussed with her.  If she has drowsiness she should skip the midday dose -Flexeril 10 mg only as needed for muscle spasms every 8 hours   Return in about 6 weeks (around 11/02/2020).

## 2020-09-22 ENCOUNTER — Other Ambulatory Visit: Payer: Self-pay | Admitting: Podiatry

## 2020-09-22 MED ORDER — OXYCODONE-ACETAMINOPHEN 10-325 MG PO TABS: 1.0000 | ORAL_TABLET | ORAL | 0 refills | Status: DC | PRN

## 2020-10-04 ENCOUNTER — Other Ambulatory Visit: Payer: Self-pay | Admitting: Orthopedic Surgery

## 2020-10-04 DIAGNOSIS — M25562 Pain in left knee: Secondary | ICD-10-CM

## 2020-10-04 DIAGNOSIS — M2352 Chronic instability of knee, left knee: Secondary | ICD-10-CM

## 2020-10-04 DIAGNOSIS — G8929 Other chronic pain: Secondary | ICD-10-CM

## 2020-10-04 DIAGNOSIS — M2392 Unspecified internal derangement of left knee: Secondary | ICD-10-CM

## 2020-10-05 ENCOUNTER — Other Ambulatory Visit: Payer: Self-pay

## 2020-10-05 ENCOUNTER — Ambulatory Visit: Payer: Medicare HMO | Attending: Podiatry | Admitting: Physical Therapy

## 2020-10-05 DIAGNOSIS — M79672 Pain in left foot: Secondary | ICD-10-CM | POA: Diagnosis present

## 2020-10-05 DIAGNOSIS — M6281 Muscle weakness (generalized): Secondary | ICD-10-CM | POA: Insufficient documentation

## 2020-10-05 DIAGNOSIS — M25675 Stiffness of left foot, not elsewhere classified: Secondary | ICD-10-CM | POA: Insufficient documentation

## 2020-10-05 DIAGNOSIS — R269 Unspecified abnormalities of gait and mobility: Secondary | ICD-10-CM | POA: Diagnosis present

## 2020-10-05 NOTE — Patient Instructions (Signed)
Access Code: [email or web address]: [email or web address]: 12/01/2021Prepared by: Casimiro Needle SherkExercises  Ankle Alphabet in Elevation - 2 x daily - 7 x weekly - 1 sets - 10 reps  Seated Toe Flexion Extension PROM - 2 x daily - 7 x weekly - 1 sets - 10 reps  Seated Plantar Fascia Mobilization with Small Ball - 2 x daily - 7 x weekly - 1 sets - 5 reps  Long Sitting Plantar Fascia Stretch with Towel - 2 x daily - 7 x weekly - 1 sets - 5 reps

## 2020-10-07 ENCOUNTER — Encounter: Payer: Self-pay | Admitting: Physical Therapy

## 2020-10-07 NOTE — Therapy (Addendum)
[facility] [address] [place], [place], [address] Phone: [phone]   Fax:  [phone]  Physical Therapy Evaluation  Patient Details  Name: Deborah Estrada MRN: [id number] Date of Birth: [birth year] Referring Provider (PT): Dr. Sharl Ma   Encounter Date: 10/05/2020    PT End of Session - 10/07/20 1945    Visit Number 1    Number of Visits 9    Date for PT Re-Evaluation 11/02/20    Authorization - Visit Number 1    Authorization - Number of Visits 10    PT Start Time 1427    PT Stop Time 1526    PT Time Calculation (min) 59 min    Activity Tolerance Patient limited by pain    Behavior During Therapy [facility] for tasks assessed/performed             Past Medical History:  Diagnosis Date  . HBP (high blood pressure)     Past Surgical History:  Procedure Laterality Date  . CHOLECYSTECTOMY    . COLONOSCOPY      There were no vitals filed for this visit.    Subjective Assessment - 10/10/20 0732    Subjective Pt. s/p L foot surgery (second and third metatarsal osteotomies) on 05/29/2020.  Pt. presents with 9-10/10 L foot pain "all day long" with no relief.  Pt. states "even the sheets on bed hurt."  Pt. c/o difficulty moving toes on L foot and lower leg swelling.    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  Pt. sleeps on back secondary to L knee/foot pain but prefers to sleep on R side.  Pt. wears New Balance shoes and has orthotics.    Patient Stated Goals Decrease pain in L foot    Currently in Pain? Yes    Pain Score 9     Pain Location Foot    Pain Orientation Left    Pain Descriptors / Indicators Sharp;Aching    Pain Type Surgical pain;Chronic pain    Pain Onset More than a month ago    Pain Frequency Constant           See flowsheet  PT unable to MMT at this time due to significant c/o pain in L foot  Guarded gait on L due to limited L  toe extension/ ankle DF and pain.     Objective measurements completed on examination: See above findings.      See HEP     PT Long Term Goals - 10/10/20 0750      PT LONG TERM GOAL #1   Title Pt. will increase FOTO to 62 to improve functional mobility.    Baseline 12/1: initial FOTO 38    Time 4    Period Weeks    Status New    Target Date 11/02/20      PT LONG TERM GOAL #2   Title Pt. will increase L ankle AROM as compared to R ankle to improve gait pattern.    Baseline Decrease L/R ankle AROM: DF: 0/4 deg., PF 44/58 deg., IV 22/30 deg., EV 0/ 8 deg.    Time 4    Period Weeks    Status New    Target Date 11/02/20      PT LONG TERM GOAL #3   Title Pt. will report <5/10 L foot/ankle pain at rest to improve pain-free mobility.    Baseline Pt. 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    Baseline high levels of L foot/ankle pain with house cleaning/ work-related tasks.    Time 4    Period Weeks    Status New    Target Date 11/02/20               Plan - 10/10/20 0742    Clinical Impression Statement Pt. is a 64 y/o female s/p L foot surgery (second and third metatarsal osteotomies) on 05/29/2020. Pt. presents with 9-10/10 L foot pain "all day long" with no relief. Pt. states "even the sheets on bed hurt." Pt. c/o difficulty moving toes on L foot and lower leg swelling.  Swelling noted in L foot (fig. 8: 46.5 cm) as compared to R (45.5 cm).  Decrease L/R ankle AROM: DF: 0/4 deg., PF 44/58 deg., IV 22/30 deg., EV 0/ 8 deg.  Pt. very tender/ pain limited with any palpation to L toes/ forefoot/ ankle during ROM assessment.  Moderate L ankle weakness grossly 3/5 MMT, with no active toe extension as compared to R.  Pt. ambulates with poor heel strike/toe off on L with a guarded gait pattern.  FOTO: initial 38/ goal  62.  Pt. will benefit from skilled PT services to increase L ankle/foot ROM and pain mgmt. to improve mobility.    Personal Factors and Comorbidities Profession    Examination-Participation Restrictions Cleaning    Stability/Clinical Decision Making Evolving/Moderate complexity    Clinical Decision Making Moderate    Rehab Potential Fair    PT Frequency 2x / week    PT Duration 4 weeks    PT Treatment/Interventions ADLs/Self Care Home Management;Cryotherapy;Moist Heat;Iontophoresis 4mg /ml Dexamethasone;Electrical Stimulation;Contrast Bath;Gait training;Stair training;Functional mobility training;Neuromuscular re-education;Therapeutic exercise;Therapeutic activities;Patient/family education;Balance training;Orthotic Fit/Training;Manual techniques;Scar mobilization;Passive range of motion;Dry needling    PT Next Visit Plan Reassess HEP/ L toe extension ROM    PT Home Exercise Plan [email or web address]           Patient will benefit from skilled therapeutic intervention in order to improve the following deficits and impairments:  Abnormal gait, Decreased balance, Decreased endurance, Decreased mobility, Difficulty walking, Hypomobility, Decreased range of motion, Decreased activity tolerance, Decreased strength, Impaired flexibility, Pain  Visit Diagnosis: Pain in left foot  Joint stiffness of left foot  Muscle weakness (generalized)  Gait difficulty     Problem List Patient Active Problem List   Diagnosis Date Noted  . Acute conjunctivitis of left eye 05/30/2018  . Acute non-recurrent maxillary sinusitis 05/30/2018  . Class 2 obesity with body mass index (BMI) of 35.0 to 35.9 in adult 05/24/2018  . High risk medication use 05/24/2018  . Constipation 06/24/2017  . Cyst of ovary 06/24/2017  . Menopause present 06/24/2017  . Adnexal cyst 10/04/2016  . Dysuria 10/04/2016  . Chronic midline low back pain with left-sided sciatica 07/24/2016  . Chronic pain of both shoulders 07/24/2016  .  Family history of diabetes mellitus in first degree relative 07/24/2016  . Neck pain 07/24/2016  . Sciatica 07/24/2016  . Foot pain, right 03/25/2014  . HTN (hypertension) 03/25/2014  . Migraine headache 03/25/2014  . Screen for colon cancer 12/17/2011   02/14/2012, PT, DPT # [id number] 10/10/2020, 7:57 AM  [place]  [facility] [address] [place], [place], [address] Phone: [phone]   Fax:  [phone]  Name: Deborah Estrada MRN: [id number] Date of Birth: 1956/07/19

## 2020-10-10 ENCOUNTER — Ambulatory Visit: Payer: Medicare HMO | Admitting: Physical Therapy

## 2020-10-10 ENCOUNTER — Other Ambulatory Visit: Payer: Self-pay

## 2020-10-10 ENCOUNTER — Encounter: Payer: Self-pay | Admitting: Physical Therapy

## 2020-10-10 DIAGNOSIS — R269 Unspecified abnormalities of gait and mobility: Secondary | ICD-10-CM

## 2020-10-10 DIAGNOSIS — M25675 Stiffness of left foot, not elsewhere classified: Secondary | ICD-10-CM

## 2020-10-10 DIAGNOSIS — M6281 Muscle weakness (generalized): Secondary | ICD-10-CM

## 2020-10-10 DIAGNOSIS — M79672 Pain in left foot: Secondary | ICD-10-CM | POA: Diagnosis not present

## 2020-10-10 NOTE — Therapy (Signed)
[facility] [address]. [place], [place], [address] Phone: [phone]   Fax:  [phone]  Physical Therapy Treatment  Patient Details  Name: Deborah Estrada MRN: [id number] Date of Birth: 1956-05-12 Referring Provider (PT): Dr. Sharl Ma   Encounter Date: 10/10/2020   PT End of Session - 10/10/20 1540    Visit Number 2    Number of Visits 9    Date for PT Re-Evaluation 11/02/20    Authorization - Visit Number 2    Authorization - Number of Visits 10    PT Start Time 1347    PT Stop Time 1430    PT Time Calculation (min) 43 min    Activity Tolerance Patient limited by pain    Behavior During Therapy [facility] for tasks assessed/performed           Past Medical History:  Diagnosis Date  . HBP (high blood pressure)     Past Surgical History:  Procedure Laterality Date  . CHOLECYSTECTOMY    . COLONOSCOPY      There were no vitals filed for this visit.   Subjective Assessment - 10/10/20 1538    Subjective Pt. entered PT with c/o constand 9-10/10 L foot pain.  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  Pt. sleeps on back secondary to L knee/foot pain but prefers to sleep on R side.  Pt. wears New Balance shoes and has orthotics.    Patient Stated Goals Decrease pain in L foot    Currently in Pain? Yes    Pain Score 9     Pain Location Foot    Pain Orientation Left    Pain Onset More than a month ago            Discussed/reviewed HEP  Manual tx.:  Alternating MH and ice to L calf during supine tx  Gentle STM to L ankle/foot in supine position   L ankle and toe AA/PROM (all planes) in a pain tolerable range 5x each (muscle guarded/ pain)  Supine L ankle IV/EV AROM (occasional PT assist)/ STM to L plantar fascia (2 nodules noted)- tender  Seated towel toe crunches (pt. Unable to complete at this time).       PT Long Term Goals - 10/10/20 0750      PT LONG TERM GOAL #1   Title Pt. will increase FOTO to 62 to improve functional mobility.    Baseline 12/1: initial FOTO 38    Time 4    Period Weeks    Status New    Target Date 11/02/20      PT LONG TERM GOAL #2   Title Pt. will increase L ankle AROM as compared to R ankle to improve gait pattern.    Baseline Decrease L/R ankle AROM: DF: 0/4 deg., PF 44/58 deg., IV 22/30 deg., EV 0/ 8 deg.    Time 4    Period Weeks    Status New    Target Date 11/02/20      PT LONG TERM GOAL #3   Title Pt. will report <5/10 L foot/ankle pain at rest to improve pain-free mobility.    Baseline Pt. 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    Baseline high levels of L foot/ankle pain with house cleaning/ work-related tasks.    Time 4    Period Weeks    Status New    Target Date 11/02/20                 Plan - 10/10/20 1540    Clinical Impression Statement Pt. entered PT with significant c/o L foot pain with no L toe off/ extension due gait.  Pt. reports increase pain while trying to complete HEP.  Increase muscle spasms noted during L great toe/ 2nd and 3rd movement.  Pt. has poor tx. tolerance with any manual tx. due to significant c/o pain.    Personal Factors and Comorbidities Profession    Examination-Participation Restrictions Cleaning    Stability/Clinical Decision Making Evolving/Moderate complexity    Clinical Decision Making Moderate    Rehab Potential Fair    PT Frequency 2x / week    PT Duration 4 weeks    PT Treatment/Interventions ADLs/Self Care Home Management;Cryotherapy;Moist Heat;Iontophoresis 4mg /ml Dexamethasone;Electrical Stimulation;Contrast Bath;Gait training;Stair training;Functional mobility training;Neuromuscular re-education;Therapeutic  exercise;Therapeutic activities;Patient/family education;Balance training;Orthotic Fit/Training;Manual techniques;Scar mobilization;Passive range of motion;Dry needling    PT Next Visit Plan Reassess HEP/ L toe extension ROM    PT Home Exercise Plan [email or web address]           Patient will benefit from skilled therapeutic intervention in order to improve the following deficits and impairments:  Abnormal gait, Decreased balance, Decreased endurance, Decreased mobility, Difficulty walking, Hypomobility, Decreased range of motion, Decreased activity tolerance, Decreased strength, Impaired flexibility, Pain  Visit Diagnosis: Pain in left foot  Joint stiffness of left foot  Muscle weakness (generalized)  Gait difficulty     Problem List Patient Active Problem List   Diagnosis Date Noted  . Acute conjunctivitis of left eye 05/30/2018  . Acute non-recurrent maxillary sinusitis 05/30/2018  . Class 2 obesity with body mass index (BMI) of 35.0 to 35.9 in adult 05/24/2018  . High risk medication use 05/24/2018  . Constipation 06/24/2017  . Cyst of ovary 06/24/2017  . Menopause present 06/24/2017  . Adnexal cyst 10/04/2016  . Dysuria 10/04/2016  . Chronic midline low back pain with left-sided sciatica 07/24/2016  . Chronic pain of both shoulders 07/24/2016  . Family history of diabetes mellitus in first degree relative 07/24/2016  . Neck pain 07/24/2016  . Sciatica 07/24/2016  . Foot pain, right 03/25/2014  . HTN (hypertension) 03/25/2014  . Migraine headache 03/25/2014  . Screen for colon cancer 12/17/2011   02/14/2012, PT, DPT # [id number] 10/10/2020, 3:45 PM  [facility] [address] [employer], [place], [place] Phone: [phone]   Fax:  [phone]  Name: Deborah Estrada MRN: Arletha Grippe Date of Birth: 10/26/1956

## 2020-10-10 NOTE — Addendum Note (Signed)
Addended by: Dorene Grebe C on: 10/10/2020 08:00 AM   Modules accepted: Orders

## 2020-10-12 ENCOUNTER — Ambulatory Visit: Payer: Medicare HMO | Admitting: Physical Therapy

## 2020-10-12 ENCOUNTER — Telehealth: Payer: Self-pay

## 2020-10-12 NOTE — Telephone Encounter (Signed)
Patient called requesting refill on her pain medication.  She said since she has been going to therapy, her foot has really been hurting.  Please advise

## 2020-10-13 ENCOUNTER — Telehealth: Payer: Self-pay

## 2020-10-13 ENCOUNTER — Other Ambulatory Visit: Payer: Self-pay | Admitting: Podiatry

## 2020-10-13 MED ORDER — OXYCODONE-ACETAMINOPHEN 10-325 MG PO TABS: 1.0000 | ORAL_TABLET | Freq: Three times a day (TID) | ORAL | 0 refills | Status: AC | PRN

## 2020-10-13 NOTE — Telephone Encounter (Signed)
done

## 2020-10-13 NOTE — Telephone Encounter (Signed)
Patient notified of medication

## 2020-10-18 ENCOUNTER — Ambulatory Visit
Admission: RE | Admit: 2020-10-18 | Discharge: 2020-10-18 | Disposition: A | Discharge status: Home / Self Care | Payer: Medicare HMO | Source: Ambulatory Visit | Attending: Orthopedic Surgery | Admitting: Orthopedic Surgery

## 2020-10-18 ENCOUNTER — Other Ambulatory Visit: Payer: Self-pay

## 2020-10-18 ENCOUNTER — Ambulatory Visit: Payer: Medicare HMO | Admitting: Physical Therapy

## 2020-10-18 DIAGNOSIS — M25562 Pain in left knee: Secondary | ICD-10-CM

## 2020-10-18 DIAGNOSIS — M79672 Pain in left foot: Secondary | ICD-10-CM

## 2020-10-18 DIAGNOSIS — M25675 Stiffness of left foot, not elsewhere classified: Secondary | ICD-10-CM

## 2020-10-18 DIAGNOSIS — R269 Unspecified abnormalities of gait and mobility: Secondary | ICD-10-CM

## 2020-10-18 DIAGNOSIS — M6281 Muscle weakness (generalized): Secondary | ICD-10-CM

## 2020-10-18 DIAGNOSIS — M2392 Unspecified internal derangement of left knee: Secondary | ICD-10-CM

## 2020-10-18 DIAGNOSIS — M2352 Chronic instability of knee, left knee: Secondary | ICD-10-CM

## 2020-10-19 ENCOUNTER — Encounter: Payer: Self-pay | Admitting: Physical Therapy

## 2020-10-19 NOTE — Therapy (Signed)
[facility] [address] [place], [place], [address] Phone: [phone]   Fax:  [phone]  Physical Therapy Treatment  Patient Details  Name: Deborah Estrada MRN: [id number] Date of Birth: February 10, 1956 Referring Provider (PT): Dr. Sharl Ma   Encounter Date: 10/18/2020   PT End of Session - 10/19/20 1829    Visit Number 3    Number of Visits 9    Date for PT Re-Evaluation 11/02/20    Authorization - Visit Number 3    Authorization - Number of Visits 10    PT Start Time 1515    PT Stop Time 1602    PT Time Calculation (min) 47 min    Activity Tolerance Patient limited by pain    Behavior During Therapy [facility] for tasks assessed/performed           Past Medical History:  Diagnosis Date  . HBP (high blood pressure)     Past Surgical History:  Procedure Laterality Date  . CHOLECYSTECTOMY    . COLONOSCOPY      There were no vitals filed for this visit.   Subjective Assessment - 10/19/20 1823    Subjective Pt. continues to report significant 9/10 L foot pain.  Pt. states she is not able to get any pain relief.  Pt. reports increase discomfort/ soreness in foot with any palpation/ soft tissue massage.    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  Pt. sleeps on back secondary to L knee/foot pain but prefers to sleep on R side.  Pt. wears New Balance shoes and has orthotics.    Patient Stated Goals Decrease pain in L foot    Currently in Pain? Yes    Pain Score 9     Pain Location Foot    Pain Orientation Left    Pain Descriptors / Indicators Constant;Aching;Burning    Pain Onset More than a month ago             Manual tx.:    Gentle STM to L ankle/foot in supine position/ demonstrate wash cloth massage for desensitization of pain symptoms (poor tolerance)   L ankle and toe AA/PROM (all planes) in a pain tolerable range 5x each (muscle guarded/  pain)  Supine L ankle IV/EV AROM (occasional PT assist)/ STM to L plantar fascia (2 nodules noted)- tender  Supine L ankle manual isometrics 5x: DF/PF/ IV/EV (pain tolerable) with holds.     PT Long Term Goals - 10/10/20 0750      PT LONG TERM GOAL #1   Title Pt. will increase FOTO to 62 to improve functional mobility.    Baseline 12/1: initial FOTO 38    Time 4    Period Weeks    Status New    Target Date 11/02/20      PT LONG TERM GOAL #2   Title Pt. will increase L ankle AROM as compared to R ankle to improve gait pattern.    Baseline Decrease L/R ankle AROM: DF: 0/4 deg., PF 44/58 deg., IV 22/30 deg., EV 0/ 8 deg.    Time 4    Period Weeks    Status New    Target Date 11/02/20      PT LONG TERM GOAL #3   Title Pt. will report <5/10 L foot/ankle pain at rest to improve pain-free mobility.    Baseline Pt. 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    Baseline high levels of L foot/ankle pain with house cleaning/ work-related tasks.    Time 4    Period Weeks    Status New    Target Date 11/02/20                 Plan - 10/19/20 1830    Clinical Impression Statement PT had another PT at the clinic [facility], PT, DPT) assess the pts. L foot due to severity of reports pain.  PT concerned about pts. inability to move toes, swelling and limited ability to tolerate gentle manual therapy.  PT demonstrate wash cloth massage to desensitize L foot to allow for AA/PROM of toes/ ankle.  Pt. is waiting to hear back from Pain Mgmt. to schedule initial consult.    Personal Factors and Comorbidities Profession    Examination-Participation Restrictions Cleaning    Stability/Clinical Decision Making Evolving/Moderate complexity    Clinical Decision Making Moderate    Rehab Potential Fair    PT Frequency 2x / week     PT Duration 4 weeks    PT Treatment/Interventions ADLs/Self Care Home Management;Cryotherapy;Moist Heat;Iontophoresis 4mg /ml Dexamethasone;Electrical Stimulation;Contrast Bath;Gait training;Stair training;Functional mobility training;Neuromuscular re-education;Therapeutic exercise;Therapeutic activities;Patient/family education;Balance training;Orthotic Fit/Training;Manual techniques;Scar mobilization;Passive range of motion;Dry needling    PT Next Visit Plan Reassess HEP/ L toe extension ROM    PT Home Exercise Plan           Patient will benefit from skilled therapeutic intervention in order to improve the following deficits and impairments:  Abnormal gait,Decreased balance,Decreased endurance,Decreased mobility,Difficulty walking,Hypomobility,Decreased range of motion,Decreased activity tolerance,Decreased strength,Impaired flexibility,Pain  Visit Diagnosis: Pain in left foot  Joint stiffness of left foot  Muscle weakness (generalized)  Gait difficulty     Problem List Patient Active Problem List   Diagnosis Date Noted  . Acute conjunctivitis of left eye 05/30/2018  . Acute non-recurrent maxillary sinusitis 05/30/2018  . Class 2 obesity with body mass index (BMI) of 35.0 to 35.9 in adult 05/24/2018  . High risk medication use 05/24/2018  . Constipation 06/24/2017  . Cyst of ovary 06/24/2017  . Menopause present 06/24/2017  . Adnexal cyst 10/04/2016  . Dysuria 10/04/2016  . Chronic midline low back pain with left-sided sciatica 07/24/2016  . Chronic pain of both shoulders 07/24/2016  . Family history of diabetes mellitus in first degree relative 07/24/2016  . Neck pain 07/24/2016  . Sciatica 07/24/2016  . Foot pain, right 03/25/2014  . HTN (hypertension) 03/25/2014  . Migraine headache 03/25/2014  . Screen for colon cancer 12/17/2011   02/14/2012, PT, DPT # [phone] 10/19/2020, 6:35 PM  [facility] [address] [place], [place], [place] Phone: [phone]   Fax:  [phone]  Name: Deborah Estrada MRN: Deborah Estrada Date of Birth: 1956-05-20

## 2020-10-20 ENCOUNTER — Ambulatory Visit: Payer: Medicare HMO | Admitting: Physical Therapy

## 2020-10-25 ENCOUNTER — Ambulatory Visit: Payer: Medicare HMO | Admitting: Physical Therapy

## 2020-10-25 ENCOUNTER — Other Ambulatory Visit: Payer: Self-pay

## 2020-10-25 ENCOUNTER — Encounter: Payer: Self-pay | Admitting: Physical Therapy

## 2020-10-25 DIAGNOSIS — M6281 Muscle weakness (generalized): Secondary | ICD-10-CM

## 2020-10-25 DIAGNOSIS — M25675 Stiffness of left foot, not elsewhere classified: Secondary | ICD-10-CM

## 2020-10-25 DIAGNOSIS — M79672 Pain in left foot: Secondary | ICD-10-CM

## 2020-10-25 DIAGNOSIS — R269 Unspecified abnormalities of gait and mobility: Secondary | ICD-10-CM

## 2020-10-25 NOTE — Therapy (Signed)
[facility] [address] [place], [place], [address] Phone: [phone]   Fax:  [phone]  Physical Therapy Treatment  Patient Details  Name: Deborah Estrada MRN: [id number] Date of Birth: 1956/03/04 Referring Provider (PT): Dr. Sharl Ma   Encounter Date: 10/25/2020   PT End of Session - 10/26/20 1803    Visit Number 4    Number of Visits 9    Date for PT Re-Evaluation 11/02/20    Authorization - Visit Number 4    Authorization - Number of Visits 10    PT Start Time 1554    PT Stop Time 1636    PT Time Calculation (min) 42 min    Activity Tolerance Patient limited by pain    Behavior During Therapy [facility] for tasks assessed/performed           Past Medical History:  Diagnosis Date   HBP (high blood pressure)     Past Surgical History:  Procedure Laterality Date   CHOLECYSTECTOMY     COLONOSCOPY      There were no vitals filed for this visit.   Subjective Assessment - 10/26/20 1758    Subjective Pt. reports persistent L foot pain.  Pt. c/o 10/10 pain in foot while walking back into PT clinic/ gym.  Pts. MRI report was negative for L knee.  Pt. continues to have episodes of L knee "locking" at night.    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  Pt. sleeps on back secondary to L knee/foot pain but prefers to sleep on R side.  Pt. wears New Balance shoes and has orthotics.    Patient Stated Goals Decrease pain in L foot    Currently in Pain? Yes    Pain Score 10-Worst pain ever    Pain Location Foot    Pain Orientation Left    Pain Descriptors / Indicators Burning;Aching;Constant    Pain Onset More than a month ago             Manual tx.:   Gentle STM to L ankle/foot in supine position/ scar massage to L incision with vitamin E lotion.  L ankle and toe AA/PROM (all planes) in a pain tolerable range 5x each (muscle guarded/  pain)  Supine L ankle IV/EV AROM (occasional PT assist)/ STM to L plantar fascia (2 nodules noted)- tender  Supine L ankle manual isometrics 5x: DF/PF/ IV/EV (pain tolerable) with holds.     PT Long Term Goals - 10/10/20 0750      PT LONG TERM GOAL #1   Title Pt. will increase FOTO to 62 to improve functional mobility.    Baseline 12/1: initial FOTO 38    Time 4    Period Weeks    Status New    Target Date 11/02/20      PT LONG TERM GOAL #2   Title Pt. will increase L ankle AROM as compared to R ankle to improve gait pattern.    Baseline Decrease L/R ankle AROM: DF: 0/4 deg., PF 44/58 deg., IV 22/30 deg., EV 0/ 8 deg.    Time 4    Period Weeks    Status New    Target Date 11/02/20      PT LONG TERM GOAL #3   Title Pt. will report <5/10 L foot/ankle pain at rest to improve pain-free mobility.    Baseline Pt. 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    Baseline high levels of L foot/ankle pain with house cleaning/ work-related tasks.    Time 4    Period Weeks    Status New    Target Date 11/02/20                 Plan - 10/26/20 1803    Clinical Impression Statement Pts. progress with L toe/ foot ROM and strengthening limited by high levels of pain.  PT focused on L scar massage and ROM of all toes/ ankle on L.  Pt. has increase great toe extension/flexion but only trace active movement of 2nd/3rd digits.  No change to HEP and PT instructed pt. to focus on toe extension during toe off phase of gait.    Personal Factors and Comorbidities Profession    Examination-Participation Restrictions Cleaning    Stability/Clinical Decision Making Evolving/Moderate complexity    Clinical Decision Making Moderate    Rehab Potential Fair    PT Frequency 2x / week    PT Duration 4 weeks    PT  Treatment/Interventions ADLs/Self Care Home Management;Cryotherapy;Moist Heat;Iontophoresis 4mg /ml Dexamethasone;Electrical Stimulation;Contrast Bath;Gait training;Stair training;Functional mobility training;Neuromuscular re-education;Therapeutic exercise;Therapeutic activities;Patient/family education;Balance training;Orthotic Fit/Training;Manual techniques;Scar mobilization;Passive range of motion;Dry needling    PT Next Visit Plan Reassess HEP/ L toe extension ROM    PT Home Exercise Plan           Patient will benefit from skilled therapeutic intervention in order to improve the following deficits and impairments:  Abnormal gait,Decreased balance,Decreased endurance,Decreased mobility,Difficulty walking,Hypomobility,Decreased range of motion,Decreased activity tolerance,Decreased strength,Impaired flexibility,Pain  Visit Diagnosis: Pain in left foot  Joint stiffness of left foot  Muscle weakness (generalized)  Gait difficulty     Problem List Patient Active Problem List   Diagnosis Date Noted   Acute conjunctivitis of left eye 05/30/2018   Acute non-recurrent maxillary sinusitis 05/30/2018   Class 2 obesity with body mass index (BMI) of 35.0 to 35.9 in adult 05/24/2018   High risk medication use 05/24/2018   Constipation 06/24/2017   Cyst of ovary 06/24/2017   Menopause present 06/24/2017   Adnexal cyst 10/04/2016   Dysuria 10/04/2016   Chronic midline low back pain with left-sided sciatica 07/24/2016   Chronic pain of both shoulders 07/24/2016   Family history of diabetes mellitus in first degree relative 07/24/2016   Neck pain 07/24/2016   Sciatica 07/24/2016   Foot pain, right 03/25/2014   HTN (hypertension) 03/25/2014   Migraine headache 03/25/2014   Screen for colon cancer 12/17/2011   02/14/2012, PT, DPT # [phone] 10/26/2020, 6:20 PM  [facility] [address] [place], [place],  [place] Phone: [phone]   Fax:  [phone]  Name: Deborah Estrada MRN: Arletha Grippe Date of Birth: 04/11/1956

## 2020-10-27 ENCOUNTER — Other Ambulatory Visit: Payer: Self-pay

## 2020-10-27 ENCOUNTER — Ambulatory Visit: Payer: Medicare HMO | Admitting: Physical Therapy

## 2020-10-27 DIAGNOSIS — R269 Unspecified abnormalities of gait and mobility: Secondary | ICD-10-CM

## 2020-10-27 DIAGNOSIS — M25675 Stiffness of left foot, not elsewhere classified: Secondary | ICD-10-CM

## 2020-10-27 DIAGNOSIS — M79672 Pain in left foot: Secondary | ICD-10-CM | POA: Diagnosis not present

## 2020-10-27 DIAGNOSIS — M6281 Muscle weakness (generalized): Secondary | ICD-10-CM

## 2020-10-30 ENCOUNTER — Encounter: Payer: Self-pay | Admitting: Physical Therapy

## 2020-10-30 NOTE — Therapy (Signed)
[facility] [address] [place], [place], [address] Phone: [phone]   Fax:  [phone]  Physical Therapy Treatment  Patient Details  Name: Deborah Estrada MRN: [id number] Date of Birth: [birth year] Referring Provider (PT): Dr. Sharl Ma   Encounter Date: 10/27/2020   PT End of Session - 10/30/20 0716    Visit Number 5    Number of Visits 9    Date for PT Re-Evaluation 11/02/20    Authorization - Visit Number 5    Authorization - Number of Visits 10    PT Start Time 1517    PT Stop Time 1558    PT Time Calculation (min) 41 min    Activity Tolerance Patient limited by pain    Behavior During Therapy [facility] for tasks assessed/performed           Past Medical History:  Diagnosis Date  . HBP (high blood pressure)     Past Surgical History:  Procedure Laterality Date  . CHOLECYSTECTOMY    . COLONOSCOPY      There were no vitals filed for this visit.   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  Pt. reports L foot always hurts and c/o 10/10 pain prior to tx. session.    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  Pt. sleeps on back secondary to L knee/foot pain but prefers to sleep on R side.  Pt. wears New Balance shoes and has orthotics.    Patient Stated Goals Decrease pain in L foot    Currently in Pain? Yes    Pain Score 10-Worst pain ever    Pain Location Foot    Pain Orientation Left    Pain Onset More than a month ago              Manual tx.:   Gentle STM to L ankle/foot in supine position/ scar massage to L incision with vitamin E lotion.  L ankle and toe AA/PROM (all planes) in a pain tolerable range 5x each (muscle guarded/ pain)  Supine L ankle IV/EV AROM (occasional PT assist)/ Pain limited with STM to 2 nodules on plantar aspect of foot.   Supine L ankle  manual isometrics 5x: DF/PF/ IV/EV (pain tolerable) with holds.  Standing toe extension/ gastroc stretches at wall (see HEP handouts).      PT Long Term Goals - 10/10/20 0750      PT LONG TERM GOAL #1   Title Pt. will increase FOTO to 62 to improve functional mobility.    Baseline 12/1: initial FOTO 38    Time 4    Period Weeks    Status New    Target Date 11/02/20      PT LONG TERM GOAL #2   Title Pt. will increase L ankle AROM as compared to R ankle to improve gait pattern.    Baseline Decrease L/R ankle AROM: DF: 0/4 deg., PF 44/58 deg., IV 22/30 deg., EV 0/ 8 deg.    Time 4    Period Weeks    Status New    Target Date 11/02/20      PT LONG TERM GOAL #3   Title Pt. will report <5/10 L foot/ankle pain at rest to improve pain-free mobility.    Baseline Pt. 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    Baseline high levels of L foot/ankle pain with house cleaning/ work-related tasks.    Time 4    Period Weeks    Status New    Target Date 11/02/20                 Plan - 10/30/20 0719    Clinical Impression Statement Increase L great toe extension A/AROM noted but gait toe off significantly limited.  PT issued 2 standing ther.ex./ stretches focusing on closed chain toe extension/ gastroc stretching.  No improvement in L foot pain and pt. scheduled to return to MD next week.  Pt. has 1 more tx. session before MD visit and PT will send noted reporting significance of pain.    Personal Factors and Comorbidities Profession    Examination-Participation Restrictions Cleaning    Stability/Clinical Decision Making Evolving/Moderate complexity    Clinical Decision Making Moderate    Rehab Potential Fair    PT Frequency 2x / week    PT Duration 4 weeks    PT Treatment/Interventions ADLs/Self Care Home  Management;Cryotherapy;Moist Heat;Iontophoresis 4mg /ml Dexamethasone;Electrical Stimulation;Contrast Bath;Gait training;Stair training;Functional mobility training;Neuromuscular re-education;Therapeutic exercise;Therapeutic activities;Patient/family education;Balance training;Orthotic Fit/Training;Manual techniques;Scar mobilization;Passive range of motion;Dry needling    PT Next Visit Plan Reassess HEP/ L toe extension ROM.   SEND MD note.    PT Home Exercise Plan [email or web address]           Patient will benefit from skilled therapeutic intervention in order to improve the following deficits and impairments:  Abnormal gait,Decreased balance,Decreased endurance,Decreased mobility,Difficulty walking,Hypomobility,Decreased range of motion,Decreased activity tolerance,Decreased strength,Impaired flexibility,Pain  Visit Diagnosis: Pain in left foot  Joint stiffness of left foot  Muscle weakness (generalized)  Gait difficulty     Problem List Patient Active Problem List   Diagnosis Date Noted  . Acute conjunctivitis of left eye 05/30/2018  . Acute non-recurrent maxillary sinusitis 05/30/2018  . Class 2 obesity with body mass index (BMI) of 35.0 to 35.9 in adult 05/24/2018  . High risk medication use 05/24/2018  . Constipation 06/24/2017  . Cyst of ovary 06/24/2017  . Menopause present 06/24/2017  . Adnexal cyst 10/04/2016  . Dysuria 10/04/2016  . Chronic midline low back pain with left-sided sciatica 07/24/2016  . Chronic pain of both shoulders 07/24/2016  . Family history of diabetes mellitus in first degree relative 07/24/2016  . Neck pain 07/24/2016  . Sciatica 07/24/2016  . Foot pain, right 03/25/2014  . HTN (hypertension) 03/25/2014  . Migraine headache 03/25/2014  . Screen for colon cancer 12/17/2011   02/14/2012, PT, DPT # [phone] 10/30/2020, 7:24 AM  [facility] [address] [place], [place], [place] Phone: [phone]    Fax:  [phone]  Name: Cale Decarolis MRN: Arletha Grippe Date of Birth: July 15, 1956

## 2020-11-01 ENCOUNTER — Ambulatory Visit: Payer: Medicare HMO | Admitting: Physical Therapy

## 2020-11-01 ENCOUNTER — Other Ambulatory Visit: Payer: Self-pay

## 2020-11-01 ENCOUNTER — Encounter: Payer: Self-pay | Admitting: Physical Therapy

## 2020-11-01 DIAGNOSIS — M79672 Pain in left foot: Secondary | ICD-10-CM | POA: Diagnosis not present

## 2020-11-01 DIAGNOSIS — M25675 Stiffness of left foot, not elsewhere classified: Secondary | ICD-10-CM

## 2020-11-01 DIAGNOSIS — R269 Unspecified abnormalities of gait and mobility: Secondary | ICD-10-CM

## 2020-11-01 DIAGNOSIS — M6281 Muscle weakness (generalized): Secondary | ICD-10-CM

## 2020-11-01 NOTE — Therapy (Signed)
[facility] [address] [place], [place], [address] Phone: [phone]   Fax:  [phone]  Physical Therapy Treatment  Patient Details  Name: Deborah Estrada MRN: [id number] Date of Birth: 05-Apr-1956 Referring Provider (PT): Dr. Lanae Crumbly   Encounter Date: 11/01/2020   PT End of Session - 11/01/20 1631    Visit Number 6    Number of Visits 9    Date for PT Re-Evaluation 11/02/20    Authorization - Visit Number 6    Authorization - Number of Visits 10    PT Start Time 1525    PT Stop Time 1602    PT Time Calculation (min) 37 min    Activity Tolerance Patient limited by pain    Behavior During Therapy [facility] for tasks assessed/performed           Past Medical History:  Diagnosis Date   HBP (high blood pressure)     Past Surgical History:  Procedure Laterality Date   CHOLECYSTECTOMY     COLONOSCOPY      There were no vitals filed for this visit.   Subjective Assessment - 11/01/20 1621    Subjective Pt. reports persistent L foot pain (10/10) with all walking/ cleaning tasks.  Pt. states she has increase swelling in L lower leg/ foot at end of day.  Pt. returns to MD for f/u visit tomorrow and scheduled with Dr. Dossie Arbour 11/07/2020.    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  Pt. sleeps on back secondary to L knee/foot pain but prefers to sleep on R side.  Pt. wears New Balance shoes and has orthotics.    Patient Stated Goals Decrease pain in L foot    Pain Score 10-Worst pain ever    Pain Location Foot    Pain Orientation Left    Pain Descriptors / Indicators Aching;Burning;Constant    Pain Onset More than a month ago            Standing prostretch 5x with holds in //-bars (pain limited)   Manual tx.:   Gentle STM to L ankle/foot in supine position/ focus on 2 nodules in L medial arch  L ankle and toe AA/PROM (all planes) in a pain  tolerable range 5x each (muscle guarded/ pain)  Reassessment of goals   Supine L ankle manual isometrics 5x: DF/PF/ IV/EV (pain tolerable) with holds.  Standing toe extension/ gastroc stretches at wall (see HEP handouts).       PT Education - 11/01/20 1630    Education Details Desensitization techniques/ contrast technique with ice and heat.    Person(s) Educated Patient    Methods Explanation;Demonstration    Comprehension Verbalized understanding;Returned demonstration               PT Long Term Goals - 11/01/20 1551      PT LONG TERM GOAL #1   Title Pt. will increase FOTO to 62 to improve functional mobility.    Baseline 12/1: initial FOTO 38.  12/28: 59 (marked improvement)- pain remains persistent.    Time 4    Period Weeks    Status Partially Met    Target Date 11/02/20      PT LONG TERM GOAL #2   Title Pt. will increase L ankle AROM as compared to R ankle to improve gait pattern.    Baseline Decrease L/R ankle AROM: DF: 0/4 deg., PF 44/58 deg., IV 22/30  deg., EV 0/ 8 deg.   12/28 (L ankle): DF 7 deg., PF: 55 deg., IV 26 deg., EV 16 deg. (R EV 23 deg.)    Time 4    Period Weeks    Status Partially Met    Target Date 11/02/20      PT LONG TERM GOAL #3   Title Pt. will report <5/10 L foot/ankle pain at rest to improve pain-free mobility.    Baseline Pt. reports 9-10/10 L foot pain.  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    Baseline high levels of L foot/ankle pain with house cleaning/ work-related tasks.    Time 4    Period Weeks    Status Not Met    Target Date 11/02/20                 Plan - 11/01/20 1632    Clinical Impression Statement Pts. progress with toe/ankle ROM and strengthening limited by significant c/o pain.  Pt. remains guarded with toe flexion/  extension and ankle DF, esp. during mid-stance to toe off phase of gait.  Pt. presents with increase R antalgic gait pattern with initial steps after standing and with increase time walked.  See updated goals.  PT recommends putting PT tx. sessions on hold until pt. has f/u with MD and Pain Management Clinic.  No improvement in pain score and persistent c/o increase pain in L plantar aspect of foot (2 nodules in arch) with and without shoe support.  Poor tx. tolerance with manual tx./ desensitization techniques to L lower leg/ foot.    Personal Factors and Comorbidities Profession    Examination-Participation Restrictions Cleaning    Stability/Clinical Decision Making Evolving/Moderate complexity    Clinical Decision Making Moderate    Rehab Potential Fair    PT Frequency 2x / week    PT Duration 4 weeks    PT Treatment/Interventions ADLs/Self Care Home Management;Cryotherapy;Moist Heat;Iontophoresis 59m/ml Dexamethasone;Electrical Stimulation;Contrast Bath;Gait training;Stair training;Functional mobility training;Neuromuscular re-education;Therapeutic exercise;Therapeutic activities;Patient/family education;Balance training;Orthotic Fit/Training;Manual techniques;Scar mobilization;Passive range of motion;Dry needling    PT Next Visit Plan Pt. will contact PT after MD appt. to discuss [place]          Patient will benefit from skilled therapeutic intervention in order to improve the following deficits and impairments:  Abnormal gait,Decreased balance,Decreased endurance,Decreased mobility,Difficulty walking,Hypomobility,Decreased range of motion,Decreased activity tolerance,Decreased strength,Impaired flexibility,Pain  Visit Diagnosis: Pain in left foot  Joint stiffness of left foot  Muscle weakness (generalized)  Gait difficulty     Problem List Patient Active Problem List   Diagnosis Date Noted   Acute conjunctivitis of left eye 05/30/2018   Acute  non-recurrent maxillary sinusitis 05/30/2018   Class 2 obesity with body mass index (BMI) of 35.0 to 35.9 in adult 05/24/2018   High risk medication use 05/24/2018   Constipation 06/24/2017   Cyst of ovary 06/24/2017   Menopause present 06/24/2017   Adnexal cyst 10/04/2016   Dysuria 10/04/2016   Chronic midline low back pain with left-sided sciatica 07/24/2016   Chronic pain of both shoulders 07/24/2016   Family history of diabetes mellitus in first degree relative 07/24/2016   Neck pain 07/24/2016  Sciatica 07/24/2016   Foot pain, right 03/25/2014   HTN (hypertension) 03/25/2014   Migraine headache 03/25/2014   Screen for colon cancer 12/17/2011   Pura Spice, PT, DPT # [phone] 11/01/2020, 6:55 PM  [facility] [address] [place], [place], [address] Phone: [phone]   Fax:  [phone]  Name: Kateryna Grantham MRN: [id number] Date of Birth: 07-15-1956

## 2020-11-02 ENCOUNTER — Ambulatory Visit (INDEPENDENT_AMBULATORY_CARE_PROVIDER_SITE_OTHER): Payer: Medicare HMO | Admitting: Podiatry

## 2020-11-02 ENCOUNTER — Encounter: Payer: Self-pay | Admitting: Podiatry

## 2020-11-02 DIAGNOSIS — M21962 Unspecified acquired deformity of left lower leg: Secondary | ICD-10-CM | POA: Diagnosis not present

## 2020-11-02 DIAGNOSIS — G8928 Other chronic postprocedural pain: Secondary | ICD-10-CM

## 2020-11-02 DIAGNOSIS — M722 Plantar fascial fibromatosis: Secondary | ICD-10-CM | POA: Diagnosis not present

## 2020-11-02 DIAGNOSIS — Z9889 Other specified postprocedural states: Secondary | ICD-10-CM

## 2020-11-02 MED ORDER — LIDOCAINE-PRILOCAINE 2.5-2.5 % EX CREA: 1.0000 "application " | TOPICAL_CREAM | CUTANEOUS | 5 refills | Status: DC | PRN

## 2020-11-02 NOTE — Progress Notes (Signed)
  Subjective:  Patient ID: Deborah Estrada, female    DOB: 05/28/1956,  MRN: [id number]  Chief Complaint  Patient presents with  . Foot Pain    "the pain is no better.  Im going to physical therapy and its going good.  They have me moving my toes some, but its really stiff at night and the knots on the bottom of my foot seem to get bigger when Im on my feet"    DOS: 07/01/20 Procedure: Left foot second and third metatarsal Weil osteotomies  64 y.o. female returns for evaluation for second opinion referral from Dr. Al Corpus following left foot surgery in August 2021. She did start physical therapy which she said is painful itself but feels like it may help a little bit. She has been using the EMLA cream which is also very helpful. Still very frustrated with her pain. The masses in the bottom of the foot are hurting as well. The toe and the foot feels stiff. She has an appointment with Dr. Laban Emperor from pain management next week.  Review of Systems: Negative except as noted in the HPI. Denies N/V/F/Ch.   Objective:  There were no vitals filed for this visit. There is no height or weight on file to calculate BMI. Constitutional Well developed. Well nourished.  Vascular Foot warm and well perfused. Capillary refill normal to all digits.   Neurologic Normal speech. Oriented to person, place, and time. Epicritic sensation to light touch grossly present bilaterally.  Dermatologic  her incision is well-healed.  The scar is not hypertrophic. There is thickening of the subcutaneous tissue.  It is sensitive to touch.  There is warmth about the area.  She exhibits some signs of hyperpathia  Orthopedic:  Stiff range of motion of the MTPJ's.  Firm nonmobile masses in the medial band of the plantar fascia that are tender to touch.   Radiographs: 3 weightbearing views of the left foot are taken, she has good alignment of the metatarsal parabola, the screws are in good position and the osteotomy sites  have healed Assessment:   1. Metatarsal deformity, left   2. Chronic post-operative pain   3. Status post left foot surgery   4. Plantar fibromatosis    Plan:  Patient was evaluated and treated and all questions answered.  S/p foot surgery left  Site and evaluated Ms. Tibbs today with my partner and her surgeon Dr. Al Corpus. He is in agreement with the plan and the evaluation. Both think that therapy is really the best thing for her at this point to improve her mobility, reduce the thickness of the underlying scar tissue within the interspace and improve her symptoms. We agreed that the plantar fibromas are a real issue but we should try to focus on healing her postoperative stiffness first before trying to tackle this. I think pain management will be very helpful as well to rule out any CRPS and developed a chronic pain plan to continue to tackle this until it is improved. I recommend she continue with multimodal analgesia with the Flexeril, gabapentin and the EMLA cream. A refill of the EMLA cream was sent to her pharmacy today. She will return to see Dr. Al Corpus after her evaluation by Dr. Laban Emperor and further physical therapy for a few months.   No follow-ups on file.

## 2020-11-03 ENCOUNTER — Ambulatory Visit: Payer: Medicare HMO | Admitting: Physical Therapy

## 2020-11-06 DIAGNOSIS — Z79899 Other long term (current) drug therapy: Secondary | ICD-10-CM | POA: Insufficient documentation

## 2020-11-06 DIAGNOSIS — M899 Disorder of bone, unspecified: Secondary | ICD-10-CM | POA: Insufficient documentation

## 2020-11-06 DIAGNOSIS — G894 Chronic pain syndrome: Secondary | ICD-10-CM | POA: Insufficient documentation

## 2020-11-06 DIAGNOSIS — Z789 Other specified health status: Secondary | ICD-10-CM | POA: Insufficient documentation
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
## 2020-11-07 ENCOUNTER — Ambulatory Visit: Payer: Medicare HMO | Admitting: Pain Medicine

## 2020-11-07 DIAGNOSIS — M899 Disorder of bone, unspecified: Secondary | ICD-10-CM

## 2020-11-07 DIAGNOSIS — G894 Chronic pain syndrome: Secondary | ICD-10-CM

## 2020-11-07 DIAGNOSIS — Z79899 Other long term (current) drug therapy: Secondary | ICD-10-CM

## 2020-11-07 DIAGNOSIS — Z789 Other specified health status: Secondary | ICD-10-CM

## 2020-11-13 NOTE — Progress Notes (Signed)
Patient: Deborah Estrada  Service Category: E/M  Provider: Gaspar Cola, MD  DOB: September 06, 1956  DOS: 11/14/2020  Referring Provider: Clarisse Gouge, MD  MRN: [id number]  Setting: Ambulatory outpatient  PCP: Ashley Jacobs, MD  Type: New Patient  Specialty: Interventional Pain Management    Location: Office  Delivery: Face-to-face     Primary Reason(s) for Visit: Encounter for initial evaluation of one or more chronic problems (new to examiner) potentially causing chronic pain, and posing a threat to normal musculoskeletal function. (Level of risk: High) CC: Foot Pain (Left/)  HPI  Deborah Estrada is a 65 y.o. year old, female patient, who comes for the first time to our practice referred by Clarisse Gouge, MD for our initial evaluation of her chronic pain. 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); Chronic pain of both shoulders; Constipation; Cyst of ovary; Dysuria; Family history of diabetes mellitus in first degree relative; Foot pain, right; Essential hypertension; Internal hemorrhoids; Menopause present; Migraine headache; Neck pain; Sciatica; Screen for colon cancer; Acute conjunctivitis of left eye; Acute non-recurrent maxillary sinusitis; Class 2 obesity with body mass index (BMI) of 35.0 to 35.9 in adult; High risk medication use; Family history of breast cancer in mother; Generalized anxiety disorder; History of migraine; Temporary low platelet count ([place]); Chronic jaw pain; Chronic pain syndrome; Pharmacologic therapy; Disorder of skeletal system; Problems influencing health status; Chronic hip pain (Bilateral); Chronic knee pain (Left); Enthesopathy of knee region (Left); Chronic foot pain (Left); Lumbar facet joint syndrome (Bilateral); Chronic sacroiliac joint pain (Bilateral); and History foot surgery (Left) on their problem list. Today she comes in for evaluation of her Foot Pain (Left/)  Pain Assessment: Location: Left Foot Radiating: Pain  radiaties to her foot and toes Onset: More than a month ago Duration: Chronic pain Quality: Aching,Burning,Stabbing,Shooting,Constant,Throbbing Severity: 8 /10 (subjective, self-reported pain score)  Effect on ADL: limits my daily activities Timing: Constant Modifying factors: physical therapy helped to move my toes BP: 123/79  HR: 81  Onset and Duration: Sudden and Present longer than 3 months Cause of pain: foot surgery 08/21 Severity: Getting worse, No change since onset, NAS-11 at its worse: 10/10, NAS-11 at its best: 10/10, NAS-11 now: 10/10 and NAS-11 on the average: 10/10 Timing: Morning, Afternoon and Night Aggravating Factors: Bending, Climbing, Kneeling, Lifiting, Prolonged sitting, Prolonged standing, Surgery made it worse, Twisting, Walking, Walking uphill, Walking downhill and Working Alleviating Factors: Stretching, Cold packs and Warm showers or baths Associated Problems: Color changes, Numbness, Spasms, Swelling, Temperature changes, Tingling, Pain that wakes patient up and Pain that does not allow patient to sleep Quality of Pain: Aching, Agonizing, Annoying, Burning, Constant, Cramping, Deep, Disabling, Getting longer, Hot, Sharp, Stabbing, Tender, Throbbing and Tingling Previous Examinations or Tests: MRI scan and X-rays Previous Treatments: Physical Therapy  According to the patient the primary area of pain is over the top of the left foot as well as the bottom where she has what appears to be a tenosynovitis affecting the plantar flexors of the big toe and the toe next to it.  The pain appears to be very localized to that area and the top of the foot.  The patient does not seem to have a stocking-like distribution to the pain.  She describes the pain as being rather sharp and occasionally burning.  Temperature of her feet was 30.1C on the left and 30.0 C on the right with a slight differential of 0.1 C.  No redness, no  swelling, no hyperalgesia or allodynia, no color or  temperature changes.  The patient was referred to [place] to evaluate for a CRPS type II however because of this type II involves documented nerve damage and there are no nerve conduction test, this would definitely not qualify until nerve damage she is proven by nerve conduction testing.  In addition, the patient's symptoms do not fulfill the criteria for CRPS.  However, the patient has been experiencing pain over the top of the foot and the bottom of the foot and it travels through the entire length of the leg raising the possibility of a radiculitis.  She does seem to have problems with the left knee however MRI of the knee shows no intra-articular pathology, once again suggesting the possibility that the knee pain may be referred.  Physical exam today demonstrated the patient to be able to toe walk and heel walk however she did have difficulties with this maneuver on the left side.  Straight leg raise was within normal limits and did not refer any pain going down the legs suggesting no meningeal signs.  Patrick maneuver was positive bilaterally for hip joint and sacroiliac joint arthralgias.  Hyperextension and rotation of the lumbar spine as well as the [facility] maneuver were both positive bilaterally for bilateral lumbar facet arthralgias.  At this point, it is my impression that this patient does not have a classical CRPS.  The patient indicates taking gabapentin 300 mg p.o. 3 times daily with marginal benefits.  However she is able to tolerate the medication without any side effects.  One of the patient's concerns and complaints involves her inability to sleep well at night due to the bed sheets putting pressure over her left foot.  Today I have recommended that she change her gabapentin regimen to taking 900 mg at bedtime to see if this can help with that pain and should she feel herself needing some additional coverage during the day, the possibility of increasing the dose and adding a daily regimen to the 900  mg at bedtime can be explored.  Today I took the time to provide the patient with information regarding my pain practice. The patient was informed that my practice is divided into two sections: an interventional pain management section, as well as a completely separate and distinct medication management section. I explained that I have procedure days for my interventional therapies, and evaluation days for follow-ups and medication management. Because of the amount of documentation required during both, they are kept separated. This means that there is the possibility that she may be scheduled for a procedure on one day, and medication management the next. I have also informed her that because of staffing and facility limitations, I no longer take patients for medication management only. To illustrate the reasons for this, I gave the patient the example of surgeons, and how inappropriate it would be to refer a patient to his/her care, just to write for the post-surgical antibiotics on a surgery done by a different surgeon.   Because interventional pain management is my board-certified specialty, the patient was informed that joining my practice means that they are open to any and all interventional therapies. I made it clear that this does not mean that they will be forced to have any procedures done. What this means is that I believe interventional therapies to be essential part of the diagnosis and proper management of chronic pain conditions. Therefore, patients not interested in these interventional alternatives will be better served  under the care of a different practitioner.  The patient was also made aware of my Comprehensive Pain Management Safety Guidelines where by joining my practice, they limit all of their nerve blocks and joint injections to those done by our practice, for as long as we are retained to manage their care.   Historic Controlled Substance Pharmacotherapy Review  PMP and  historical list of controlled substances: Oxycodone/APAP 10/325 1 tablet p.o. 3 times daily (30 mg/day of oxycodone) Current opioid analgesics:  Oxycodone/APAP 10/325 1 tablet p.o. 3 times daily (30 mg/day of oxycodone)  MME/day: 45 mg/day  Historical Monitoring: The patient  reports no history of drug use. List of all UDS Test(s): No results found for: MDMA, COCAINSCRNUR, [place], [place], CANNABQUANT, THCU, [place] List of other Serum/Urine Drug Screening Test(s):  No results found for: AMPHSCRSER, BARBSCRSER, BENZOSCRSER, COCAINSCRSER, COCAINSCRNUR, PCPSCRSER, PCPQUANT, THCSCRSER, THCU, CANNABQUANT, OPIATESCRSER, OXYSCRSER, PROPOXSCRSER, ETH Historical Background Evaluation: [place] PMP: PDMP reviewed during this encounter. Online review of the past 44-monthperiod conducted.             PMP NARX Score Report:  Narcotic: 360 Sedative: 190 Stimulant: 000  Department of public safety, offender search: (Editor, commissioningInformation) Non-contributory Risk Assessment Profile: Aberrant behavior: None observed or detected today Risk factors for fatal opioid overdose: None identified today PMP NARX Overdose Risk Score: 350 Fatal overdose hazard ratio (HR): Calculation deferred Non-fatal overdose hazard ratio (HR): Calculation deferred Risk of opioid abuse or dependence: 0.7-3.0% with doses ? 36 MME/day and 6.1-26% with doses ? 120 MME/day. Substance use disorder (SUD) risk level: See below Personal History of Substance Abuse (SUD-Substance use disorder):  Alcohol: Negative  Illegal Drugs: Negative  Rx Drugs: Negative  ORT Risk Level calculation: Low Risk  Opioid Risk Tool - 11/14/20 1501      Family History of Substance Abuse   Alcohol Negative    Illegal Drugs Negative    Rx Drugs Negative      Personal History of Substance Abuse   Alcohol Negative    Illegal Drugs Negative    Rx Drugs Negative      Age   Age between 111-45years  No      History of Preadolescent Sexual Abuse   History of  Preadolescent Sexual Abuse Negative or Female      Psychological Disease   Psychological Disease Negative    Depression Negative      Total Score   Opioid Risk Tool Scoring 0    Opioid Risk Interpretation Low Risk          ORT Scoring interpretation table:  Score <3 = Low Risk for SUD  Score between 4-7 = Moderate Risk for SUD  Score >8 = High Risk for Opioid Abuse   Pharmacologic Plan: As per protocol, I have not taken over any controlled substance management, pending the results of ordered tests and/or consults.            Initial impression: Pending review of available data and ordered tests.  Meds   Current Outpatient Medications:  .  Ascorbic Acid (VITAMIN C WITH ROSE HIPS) 500 MG tablet, Take 500 mg by mouth daily., Disp: , Rfl:  .  aspirin 81 MG chewable tablet, Chew by mouth daily., Disp: , Rfl:  .  Cholecalciferol (VITAMIN D) 2000 units tablet, Take by mouth., Disp: , Rfl:  .  fexofenadine (ALLEGRA) 180 MG tablet, Take 180 mg by mouth daily., Disp: , Rfl:  .  fluticasone (FLONASE) 50 MCG/ACT nasal spray,  SHAKE LQ AND U 2 SPRAYS IEN D, Disp: , Rfl: 0 .  gabapentin (NEURONTIN) 300 MG capsule, Take 1 capsule (300 mg total) by mouth 3 (three) times daily., Disp: 90 capsule, Rfl: 3 .  lidocaine-prilocaine (EMLA) cream, Apply 1 application topically as needed., Disp: 60 g, Rfl: 5 .  Multiple Vitamin (MULTIVITAMIN WITH MINERALS) TABS tablet, Take 1 tablet by mouth daily., Disp: , Rfl:  .  NONFORMULARY OR COMPOUNDED ITEM, [facility] Pain Cream 30 gm pump Apply 1-2 pumps 3-4 x daily 3 refills Sent in 05-07-19, Disp: , Rfl:  .  NONFORMULARY OR COMPOUNDED ITEM, Apply 2-3 pumps 3-4 times daily as needed, Disp: 120 each, Rfl: 3 .  ondansetron (ZOFRAN) 4 MG tablet, Take 1 tablet (4 mg total) by mouth every 8 (eight) hours as needed., Disp: 20 tablet, Rfl: 0 .  ondansetron (ZOFRAN-ODT) 4 MG disintegrating tablet, Take 4 mg by mouth every 8 (eight) hours as needed., Disp: , Rfl:   .  promethazine (PHENERGAN) 25 MG tablet, Take 1 tablet (25 mg total) by mouth every 8 (eight) hours as needed., Disp: 20 tablet, Rfl: 0 .  triamterene-hydrochlorothiazide (MAXZIDE-25) 37.5-25 MG tablet, TK 1 T PO QD, Disp: , Rfl: 0 .  vitamin B-12 (CYANOCOBALAMIN) 1000 MCG tablet, Take 1,000 mcg by mouth daily., Disp: , Rfl:   Imaging Review  Cervical Imaging: Cervical MR wo contrast: Results for orders placed during the hospital encounter of 06/18/20 MR CERVICAL SPINE WO CONTRAST  Narrative CLINICAL DATA:  Initial evaluation for neck pain with extension into the left upper extremity.  EXAM: MRI CERVICAL SPINE WITHOUT CONTRAST  TECHNIQUE: Multiplanar, multisequence MR imaging of the cervical spine was performed. No intravenous contrast was administered.  COMPARISON:  None available.  FINDINGS: Alignment: Straightening of the normal cervical lordosis. No listhesis or subluxation.  Vertebrae: Vertebral body height maintained without evidence for acute or chronic fracture. Bone marrow signal intensity within normal limits. No discrete or worrisome osseous lesion. No abnormal marrow edema.  Cord: Signal intensity within the cervical spinal cord is within normal limits.  Posterior Fossa, vertebral arteries, paraspinal tissues: Visualized brain and posterior fossa within normal limits. Craniocervical junction normal. Paraspinous and prevertebral soft tissues demonstrate no acute finding. Normal flow voids seen within the vertebral arteries bilaterally. 15 mm well-circumscribed ovoid cystic lesion noted at the base of tongue/vallecula, nonspecific, but could reflect a small thyroglossal duct cyst. Finding of doubtful significance.  Disc levels:  C2-C3: Unremarkable.  C3-C4:  Unremarkable.  C4-C5: Small central disc protrusion indents the ventral thecal sac (series 9, image 13). No significant spinal stenosis or cord deformity. Foramina remain patent.  C5-C6:  Degenerative intervertebral disc space narrowing with diffuse degenerative disc osteophyte, asymmetric to the left. Mild flattening of the ventral thecal sac without significant spinal stenosis or cord deformity. Moderate left C6 foraminal narrowing. No significant right foraminal encroachment.  C6-C7: Degenerative intervertebral disc space narrowing with diffuse degenerative disc osteophyte. Mild flattening of the ventral thecal sac without significant stenosis or cord deformity. Mild right C7 foraminal stenosis. No significant left foraminal encroachment.  C7-T1: Normal interspace. Mild facet hypertrophy. No stenosis or impingement.  Visualized upper thoracic spine demonstrates no significant finding.  IMPRESSION: 1. Left eccentric disc osteophyte complex at C5-6 with resultant moderate left C6 foraminal stenosis. Query left C6 radiculitis. 2. Degenerative disc osteophyte at C6-7 with resultant mild right C7 foraminal stenosis. 3. Small central disc protrusion at C4-5 without significant stenosis or impingement.   Electronically Signed By: Marland Kitchen  Jeannine Boga M.D. On: 06/19/2020 08:25  Shoulder Imaging: Shoulder-L MR wo contrast: Results for orders placed during the hospital encounter of 01/08/17 MR SHOULDER LEFT WO CONTRAST  Narrative CLINICAL DATA:  65 year old with left shoulder pain and left arm weakness for 5 weeks after falling down stairs. No previous relevant surgery.  EXAM: MRI OF THE LEFT SHOULDER WITHOUT CONTRAST  TECHNIQUE: Multiplanar, multisequence MR imaging of the shoulder was performed. No intravenous contrast was administered.  COMPARISON:  Radiographs 12/10/2016.  FINDINGS: Rotator cuff: Mild supraspinatus tendinosis versus contusion. No evidence of rotator cuff tear. The infraspinatus, subscapularis and teres minor tendons appear normal.  Muscles:  No focal muscular atrophy or edema.  Biceps long head:  Intact and normally  positioned.  Acromioclavicular Joint: The acromion is type 1. There are mild acromioclavicular degenerative changes. Trace fluid in the subacromial -subdeltoid bursa.  Glenohumeral Joint: No significant shoulder joint effusion or glenohumeral arthropathy.  Labrum: Labral evaluation is limited by the lack of joint fluid. No evidence of labral tear or paralabral cyst.  Bones: No acute or significant extra-articular osseous findings.  Other: No significant soft tissue findings.  IMPRESSION: 1. Mild supraspinatus tendinosis or contusion. No evidence of rotator cuff tear. 2. The biceps tendon and labrum appear intact. 3. Mild acromioclavicular degenerative changes.   Electronically Signed By: Richardean Sale M.D. On: 01/09/2017 08:59  Shoulder-L DG: Results for orders placed during the hospital encounter of 12/10/16 DG Shoulder Left  Narrative CLINICAL DATA:  65 year old who fell down approximately 2 stairs 1 month ago and grabbed the hand rail with her left arm. Persistent pain in the left shoulder and left wrist. Initial encounter.  EXAM: LEFT SHOULDER - 2+ VIEW  COMPARISON:  None.  FINDINGS: No evidence of acute fracture or glenohumeral dislocation. Subacromial space well preserved. Acromioclavicular joint intact. Well preserved bone mineral density. No intrinsic osseous abnormality.  IMPRESSION: Normal examination.   Electronically Signed By: Evangeline Dakin M.D. On: 12/10/2016 16:55  Knee Imaging: Knee-L MR w contrast: Results for orders placed during the hospital encounter of 10/18/20 MR KNEE LEFT WO CONTRAST  Narrative CLINICAL DATA:  Left knee pain since foot surgery in August. No injury or prior surgery.  EXAM: MRI OF THE LEFT KNEE WITHOUT CONTRAST  TECHNIQUE: Multiplanar, multisequence MR imaging of the knee was performed. No intravenous contrast was administered.  COMPARISON:  None.  FINDINGS: MENISCI  Medial meniscus:   Intact.  Lateral meniscus:  Intact.  LIGAMENTS  Cruciates:  Intact ACL and PCL.  Collaterals: Medial collateral ligament is intact. Lateral collateral ligament complex is intact.  CARTILAGE  Patellofemoral:  No chondral defect.  Medial:  No chondral defect.  Lateral:  No chondral defect.  Joint:  No joint effusion. Normal Hoffa's fat. No plical thickening.  Popliteal Fossa:  No Baker cyst. Intact popliteus tendon.  Extensor Mechanism: Intact quadriceps tendon and patellar tendon. Intact medial and lateral patellar retinaculum. Intact MPFL.  Bones: No focal marrow signal abnormality. No fracture or dislocation.  Other: None.  IMPRESSION: 1. No evidence of internal derangement. 2. No significant degenerative changes.   Electronically Signed By: Titus Dubin M.D. On: 10/19/2020 10:25  Wrist Imaging: Wrist-L DG Complete: Results for orders placed during the hospital encounter of 12/10/16 DG Wrist Complete Left  Narrative CLINICAL DATA:  65 year old who fell down approximately 2 stairs 1 month ago and grabbed the hand rail with her left arm. Persistent pain in the left shoulder and left wrist. Initial encounter.  EXAM: LEFT WRIST - COMPLETE  3+ VIEW  COMPARISON:  None.  FINDINGS: No evidence of acute fracture or dislocation. Joint spaces well preserved. Well-preserved bone mineral density. No intrinsic osseous abnormalities.  IMPRESSION: Normal examination.   Electronically Signed By: Evangeline Dakin M.D. On: 12/10/2016 16:56  Complexity Note: Imaging results reviewed. Results shared with Deborah Estrada, using Layman's terms.                        ROS  Cardiovascular: High blood pressure Pulmonary or Respiratory: No reported pulmonary signs or symptoms such as wheezing and difficulty taking a deep full breath (Asthma), difficulty blowing air out (Emphysema), coughing up mucus (Bronchitis), persistent dry cough, or temporary stoppage of breathing  during sleep Neurological: No reported neurological signs or symptoms such as seizures, abnormal skin sensations, urinary and/or fecal incontinence, being born with an abnormal open spine and/or a tethered spinal cord Psychological-Psychiatric: No reported psychological or psychiatric signs or symptoms such as difficulty sleeping, anxiety, depression, delusions or hallucinations (schizophrenial), mood swings (bipolar disorders) or suicidal ideations or attempts Gastrointestinal: Reflux or heatburn Genitourinary: No reported renal or genitourinary signs or symptoms such as difficulty voiding or producing urine, peeing blood, non-functioning kidney, kidney stones, difficulty emptying the bladder, difficulty controlling the flow of urine, or chronic kidney disease Hematological: No reported hematological signs or symptoms such as prolonged bleeding, low or poor functioning platelets, bruising or bleeding easily, hereditary bleeding problems, low energy levels due to low hemoglobin or being anemic Endocrine: No reported endocrine signs or symptoms such as high or low blood sugar, rapid heart rate due to high thyroid levels, obesity or weight gain due to slow thyroid or thyroid disease Rheumatologic: No reported rheumatological signs and symptoms such as fatigue, joint pain, tenderness, swelling, redness, heat, stiffness, decreased range of motion, with or without associated rash Musculoskeletal: Negative for myasthenia gravis, muscular dystrophy, multiple sclerosis or malignant hyperthermia Work History: Working full time  Allergies  Deborah Estrada is allergic to no known allergies.  Laboratory Chemistry Profile   Renal Lab Results  Component Value Date   BUN 13 06/20/2012   CREATININE 0.79 06/20/2012   GFRAA >60 06/20/2012   GFRNONAA >60 06/20/2012   PROTEINUR Negative 06/20/2012     Electrolytes Lab Results  Component Value Date   NA 142 06/20/2012   K 3.8 06/20/2012   CL 106 06/20/2012    CALCIUM 9.5 06/20/2012     Hepatic Lab Results  Component Value Date   AST 20 06/20/2012   ALT 23 06/20/2012   ALBUMIN 3.8 06/20/2012   ALKPHOS 93 06/20/2012   LIPASE 113 06/20/2012     ID No results found for: LYMEIGGIGMAB, HIV, SARSCOV2NAA, STAPHAUREUS, MRSAPCR, HCVAB, PREGTESTUR, RMSFIGG, QFVRPH1IGG, QFVRPH2IGG, LYMEIGGIGMAB   Bone No results found for: VD25OH, H139778, [id number], [id number], 25OHVITD1, 25OHVITD2, 25OHVITD3, TESTOFREE, TESTOSTERONE   Endocrine Lab Results  Component Value Date   GLUCOSE 88 06/20/2012   GLUCOSEU Negative 06/20/2012     Neuropathy No results found for: VITAMINB12, FOLATE, HGBA1C, HIV   CNS No results found for: COLORCSF, APPEARCSF, RBCCOUNTCSF, WBCCSF, POLYSCSF, LYMPHSCSF, EOSCSF, PROTEINCSF, GLUCCSF, JCVIRUS, CSFOLI, IGGCSF, LABACHR, ACETBL, LABACHR, ACETBL   Inflammation (CRP: Acute  ESR: Chronic) No results found for: CRP, ESRSEDRATE, LATICACIDVEN   Rheumatology No results found for: RF, ANA, LABURIC, URICUR, LYMEIGGIGMAB, LYMEABIGMQN, HLAB27   Coagulation Lab Results  Component Value Date   PLT 188 06/20/2012     Cardiovascular Lab Results  Component Value Date   HGB 14.5 06/20/2012  HCT 41.7 06/20/2012     Screening No results found for: SARSCOV2NAA, COVIDSOURCE, STAPHAUREUS, MRSAPCR, HCVAB, HIV, PREGTESTUR   Cancer No results found for: CEA, CA125, LABCA2   Allergens No results found for: ALMOND, APPLE, ASPARAGUS, AVOCADO, BANANA, BARLEY, BASIL, BAYLEAF, GREENBEAN, LIMABEAN, WHITEBEAN, BEEFIGE, REDBEET, BLUEBERRY, BROCCOLI, CABBAGE, MELON, CARROT, CASEIN, CASHEWNUT, CAULIFLOWER, CELERY     Note: Lab results reviewed.  [place]  Drug: Deborah Estrada  reports no history of drug use. Alcohol:  reports current alcohol use. Tobacco:  reports that she has never smoked. She has never used smokeless tobacco. Medical:  has a past medical history of HBP (high blood pressure). Family: family history includes Breast cancer in  her mother; Heart attack in her father.  Past Surgical History:  Procedure Laterality Date  . CHOLECYSTECTOMY    . COLONOSCOPY     Active Ambulatory Problems    Diagnosis Date Noted  . Adnexal cyst 10/04/2016  . 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 Chronic pain of both shoulders 07/24/2016  . Constipation 06/24/2017  . Cyst of ovary 06/24/2017  . Dysuria 10/04/2016  . Family history of diabetes mellitus in first degree relative 07/24/2016  . Foot pain, right 03/25/2014  . Essential hypertension 03/25/2014  . Internal hemorrhoids 12/31/2011  . Menopause present 06/24/2017  . Migraine headache 03/25/2014  . Neck pain 07/24/2016  . Sciatica 07/24/2016  . Screen for colon cancer 12/17/2011  . Acute conjunctivitis of left eye 05/30/2018  . Acute non-recurrent maxillary sinusitis 05/30/2018  . Class 2 obesity with body mass index (BMI) of 35.0 to 35.9 in adult 05/24/2018  . High risk medication use 05/24/2018  . Family history of breast cancer in mother 12/01/2019  . Generalized anxiety disorder 12/01/2019  . History of migraine 03/25/2014  . Temporary low platelet count ([place]) 12/03/2019  . Chronic jaw pain 12/01/2019  . Chronic pain syndrome 11/06/2020  . Pharmacologic therapy 11/06/2020  . Disorder of skeletal system 11/06/2020  . Problems influencing health status 11/06/2020  . Chronic hip pain (Bilateral) 11/14/2020  . Chronic knee pain (Left) 11/14/2020  . Enthesopathy of knee region (Left) 11/14/2020  . Chronic foot pain (Left) 11/14/2020  . Lumbar facet joint syndrome (Bilateral) 11/14/2020  . Chronic sacroiliac joint pain (Bilateral) 11/14/2020  . History foot surgery (Left) 11/14/2020   Resolved Ambulatory Problems    Diagnosis Date Noted  . No Resolved Ambulatory Problems   Past Medical History:  Diagnosis Date  . HBP (high blood pressure)    Constitutional Exam  General appearance: Well nourished, well developed, and well  hydrated. In no apparent acute distress Vitals:   11/14/20 1445  BP: 123/79  Pulse: 81  Temp: 98.7 F (37.1 C)  SpO2: 99%  Weight: 150 lb (68 kg)  Height: _0  (1.626 m)   BMI Assessment: Estimated body mass index is 25.75 kg/m as calculated from the following:   Height as of this encounter: _1  (1.626 m).   Weight as of this encounter: 150 lb (68 kg).  BMI interpretation table: BMI level Category Range association with higher incidence of chronic pain  <18 kg/m2 Underweight   18.5-24.9 kg/m2 Ideal body weight   25-29.9 kg/m2 Overweight Increased incidence by 20%  30-34.9 kg/m2 Obese (Class I) Increased incidence by 68%  35-39.9 kg/m2 Severe obesity (Class II) Increased incidence by 136%  >40 kg/m2 Extreme obesity (Class III) Increased incidence by 254%   Patient's current BMI Ideal Body weight  Body mass index  is 25.75 kg/m. Ideal body weight: 54.7 kg (120 lb 9.5 oz) Adjusted ideal body weight: 60 kg (132 lb 5.7 oz)   BMI Readings from Last 4 Encounters:  11/14/20 25.75 kg/m  06/11/17 25.23 kg/m  03/11/17 25.23 kg/m  03/07/17 27.12 kg/m   Wt Readings from Last 4 Encounters:  11/14/20 150 lb (68 kg)  03/11/17 147 lb (66.7 kg)  03/07/17 158 lb (71.7 kg)  12/10/16 155 lb (70.3 kg)    Psych/Mental status: Alert, oriented x 3 (person, place, & time)       Eyes: PERLA Respiratory: No evidence of acute respiratory distress  Assessment  Primary Diagnosis & Pertinent Problem List: The primary encounter diagnosis was Chronic pain syndrome. Diagnoses of Chronic hip pain (Bilateral), Chronic knee pain (Left), Enthesopathy of knee region (Left), Chronic foot pain (Left), Chronic bilateral low back pain with left-sided sciatica, Lumbar facet joint syndrome (Bilateral), Chronic sacroiliac joint pain (Bilateral), History foot surgery (Left), Pharmacologic therapy, Disorder of skeletal system, and Problems influencing health status were also pertinent to this visit.  Visit  Diagnosis (New problems to examiner): 1. Chronic pain syndrome   2. Chronic hip pain (Bilateral)   3. Chronic knee pain (Left)   4. Enthesopathy of knee region (Left)   5. Chronic foot pain (Left)   6. Chronic bilateral low back pain with left-sided sciatica   7. Lumbar facet joint syndrome (Bilateral)   8. Chronic sacroiliac joint pain (Bilateral)   9. History foot surgery (Left)   10. Pharmacologic therapy   11. Disorder of skeletal system   12. Problems influencing health status    Plan of Care (Initial workup plan)  Note: Deborah Estrada was reminded that as per protocol, today's visit has been an evaluation only. We have not taken over the patient's controlled substance management.  Problem-specific plan: No problem-specific Assessment & Plan notes found for this encounter.   Lab Orders     Compliance Drug Analysis, Ur     Comp. Metabolic Panel (12)     Magnesium     Vitamin B12     Sedimentation rate     25-Hydroxy vitamin D Lcms D2+D3     C-reactive protein  Imaging Orders     DG Lumbar Spine Complete W/Bend     DG HIP UNILAT W OR W/O PELVIS 2-3 VIEWS RIGHT     DG HIP UNILAT W OR W/O PELVIS 2-3 VIEWS LEFT Referral Orders  No referral(s) requested today   Procedure Orders    No procedure(s) ordered today   Pharmacotherapy (current): Medications ordered:  No orders of the defined types were placed in this encounter.  Medications administered during this visit: Delayla L. Guarino had no medications administered during this visit.   Pharmacological management options:  Opioid Analgesics: The patient was informed that there is no guarantee that she would be a candidate for opioid analgesics. The decision will be made following CDC guidelines. This decision will be based on the results of diagnostic studies, as well as Deborah Estrada's risk profile.   Membrane stabilizer: To be determined at a later time  Muscle relaxant: To be determined at a later time  NSAID: To be  determined at a later time  Other analgesic(s): To be determined at a later time   Interventional management options: Deborah Estrada was informed that there is no guarantee that she would be a candidate for interventional therapies. The decision will be based on the results of diagnostic studies, as well as Deborah Estrada's  risk profile.  Procedure(s) under consideration:  Should the question of the CRP continues, diagnostic left lumbar sympathetic block.  Diagnostic left L4-5 LESI  Diagnostic bilateral lumbar facet block  Diagnostic bilateral IA hip joint injection  Diagnostic bilateral SI joint injection  Diagnostic left IA knee joint injection    Provider-requested follow-up: Return for (F2F), (2V) post-test eval (40 min), (s/p Tests).  Future Appointments  Date Time Provider [place]  11/15/2020  4:45 PM Pura Spice, PT [facility] None  11/28/2020  9:00 AM Pura Spice, PT [facility] None  12/06/2020  3:15 PM Pura Spice, PT [facility] None  12/08/2020  4:45 PM Clementeen Hoof Rebeca Alert, PT [facility] None    Note by: Gaspar Cola, MD Date: 11/14/2020; Time: 4:12 PM

## 2020-11-14 ENCOUNTER — Other Ambulatory Visit: Payer: Self-pay

## 2020-11-14 ENCOUNTER — Encounter: Payer: Self-pay | Admitting: Pain Medicine

## 2020-11-14 ENCOUNTER — Ambulatory Visit: Payer: Medicare HMO | Attending: Pain Medicine | Admitting: Pain Medicine

## 2020-11-14 VITALS — BP 123/79 | HR 81 | Temp 98.7°F | Ht 64.0 in | Wt 150.0 lb

## 2020-11-14 DIAGNOSIS — Z9889 Other specified postprocedural states: Secondary | ICD-10-CM | POA: Insufficient documentation

## 2020-11-14 DIAGNOSIS — M25562 Pain in left knee: Secondary | ICD-10-CM | POA: Insufficient documentation

## 2020-11-14 DIAGNOSIS — M533 Sacrococcygeal disorders, not elsewhere classified: Secondary | ICD-10-CM | POA: Diagnosis present

## 2020-11-14 DIAGNOSIS — M76892 Other specified enthesopathies of left lower limb, excluding foot: Secondary | ICD-10-CM | POA: Diagnosis not present

## 2020-11-14 DIAGNOSIS — G894 Chronic pain syndrome: Secondary | ICD-10-CM | POA: Diagnosis present

## 2020-11-14 DIAGNOSIS — M79672 Pain in left foot: Secondary | ICD-10-CM | POA: Insufficient documentation

## 2020-11-14 DIAGNOSIS — Z79899 Other long term (current) drug therapy: Secondary | ICD-10-CM | POA: Insufficient documentation

## 2020-11-14 DIAGNOSIS — M25552 Pain in left hip: Secondary | ICD-10-CM

## 2020-11-14 DIAGNOSIS — M899 Disorder of bone, unspecified: Secondary | ICD-10-CM | POA: Insufficient documentation

## 2020-11-14 DIAGNOSIS — M47816 Spondylosis without myelopathy or radiculopathy, lumbar region: Secondary | ICD-10-CM | POA: Diagnosis present

## 2020-11-14 DIAGNOSIS — G8929 Other chronic pain: Secondary | ICD-10-CM | POA: Diagnosis present

## 2020-11-14 DIAGNOSIS — M5442 Lumbago with sciatica, left side: Secondary | ICD-10-CM | POA: Insufficient documentation

## 2020-11-14 DIAGNOSIS — Z789 Other specified health status: Secondary | ICD-10-CM | POA: Insufficient documentation

## 2020-11-14 DIAGNOSIS — M25551 Pain in right hip: Secondary | ICD-10-CM | POA: Insufficient documentation

## 2020-11-14 NOTE — Progress Notes (Signed)
Safety precautions to be maintained throughout the outpatient stay will include: orient to surroundings, keep bed in low position, maintain call bell within reach at all times, provide assistance with transfer out of bed and ambulation.  

## 2020-11-14 NOTE — Patient Instructions (Signed)
____________________________________________________________________________________________  General Risks and Possible Complications  Patient Responsibilities: It is important that you read this as it is part of your informed consent. It is our duty to inform you of the risks and possible complications associated with treatments offered to you. It is your responsibility as a patient to read this and to ask questions about anything that is not clear or that you believe was not covered in this document.  Patient's Rights: You have the right to refuse treatment. You also have the right to change your mind, even after initially having agreed to have the treatment done. However, under this last option, if you wait until the last second to change your mind, you may be charged for the materials used up to that point.  Introduction: Medicine is not an exact science. Everything in Medicine, including the lack of treatment(s), carries the potential for danger, harm, or loss (which is by definition: Risk). In Medicine, a complication is a secondary problem, condition, or disease that can aggravate an already existing one. All treatments carry the risk of possible complications. The fact that a side effects or complications occurs, does not imply that the treatment was conducted incorrectly. It must be clearly understood that these can happen even when everything is done following the highest safety standards.  No treatment: You can choose not to proceed with the proposed treatment alternative. The "PRO(s)" would include: avoiding the risk of complications associated with the therapy. The "CON(s)" would include: not getting any of the treatment benefits. These benefits fall under one of three categories: diagnostic; therapeutic; and/or palliative. Diagnostic benefits include: getting information which can ultimately lead to improvement of the disease or symptom(s). Therapeutic benefits are those associated with the  successful treatment of the disease. Finally, palliative benefits are those related to the decrease of the primary symptoms, without necessarily curing the condition (example: decreasing the pain from a flare-up of a chronic condition, such as incurable terminal cancer).  General Risks and Complications: These are associated to most interventional treatments. They can occur alone, or in combination. They fall under one of the following six (6) categories: no benefit or worsening of symptoms; bleeding; infection; nerve damage; allergic reactions; and/or death. 1. No benefits or worsening of symptoms: In Medicine there are no guarantees, only probabilities. No healthcare provider can ever guarantee that a medical treatment will work, they can only state the probability that it may. Furthermore, there is always the possibility that the condition may worsen, either directly, or indirectly, as a consequence of the treatment. 2. Bleeding: This is more common if the patient is taking a blood thinner, either prescription or over the counter (example: Goody Powders, Fish oil, Aspirin, Garlic, etc.), or if suffering a condition associated with impaired coagulation (example: Hemophilia, cirrhosis of the liver, low platelet counts, etc.). However, even if you do not have one on these, it can still happen. If you have any of these conditions, or take one of these drugs, make sure to notify your treating physician. 3. Infection: This is more common in patients with a compromised immune system, either due to disease (example: diabetes, cancer, human immunodeficiency virus [HIV], etc.), or due to medications or treatments (example: therapies used to treat cancer and rheumatological diseases). However, even if you do not have one on these, it can still happen. If you have any of these conditions, or take one of these drugs, make sure to notify your treating physician. 4. Nerve Damage: This is more common when the   treatment is  an invasive one, but it can also happen with the use of medications, such as those used in the treatment of cancer. The damage can occur to small secondary nerves, or to large primary ones, such as those in the spinal cord and brain. This damage may be temporary or permanent and it may lead to impairments that can range from temporary numbness to permanent paralysis and/or brain death. 5. Allergic Reactions: Any time a substance or material comes in contact with our body, there is the possibility of an allergic reaction. These can range from a mild skin rash (contact dermatitis) to a severe systemic reaction (anaphylactic reaction), which can result in death. 6. Death: In general, any medical intervention can result in death, most of the time due to an unforeseen complication. ____________________________________________________________________________________________   

## 2020-11-15 ENCOUNTER — Ambulatory Visit: Payer: Medicare HMO | Attending: Podiatry

## 2020-11-15 DIAGNOSIS — M6281 Muscle weakness (generalized): Secondary | ICD-10-CM | POA: Diagnosis present

## 2020-11-15 DIAGNOSIS — R269 Unspecified abnormalities of gait and mobility: Secondary | ICD-10-CM | POA: Insufficient documentation

## 2020-11-15 DIAGNOSIS — M25675 Stiffness of left foot, not elsewhere classified: Secondary | ICD-10-CM | POA: Insufficient documentation

## 2020-11-15 DIAGNOSIS — M79672 Pain in left foot: Secondary | ICD-10-CM | POA: Insufficient documentation

## 2020-11-15 NOTE — Therapy (Signed)
[facility] [address]. [place], [place], [address] Phone: [phone]   Fax:  [phone]  Physical Therapy Treatment and Re-certification   Patient Details  Name: Deborah Estrada MRN: [id number] Date of Birth: 1955/12/10 Referring Provider (PT): Dr. Lanae Crumbly   Encounter Date: 11/15/2020   PT End of Session - 11/15/20 1658    Visit Number 7    Number of Visits 9    Date for PT Re-Evaluation 12/13/20    Authorization - Visit Number 7    Authorization - Number of Visits 10    PT Start Time 3151    PT Stop Time 1730    PT Time Calculation (min) 44 min    Activity Tolerance Patient limited by pain    Behavior During Therapy [facility] for tasks assessed/performed           Past Medical History:  Diagnosis Date  . HBP (high blood pressure)     Past Surgical History:  Procedure Laterality Date  . CHOLECYSTECTOMY    . COLONOSCOPY      There were no vitals filed for this visit.   Subjective Assessment - 11/15/20 1656    Subjective Pt reported that she was frustrated with her visit with the MD yesterday, will be looking into a second opinion as able.    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  Pt. sleeps on back secondary to L knee/foot pain but prefers to sleep on R side.  Pt. wears New Balance shoes and has orthotics.    Patient Stated Goals Decrease pain in L foot    Currently in Pain? Yes    Pain Score 6     Pain Location Foot    Pain Orientation Left    Pain Descriptors / Indicators Aching;Burning;Stabbing;Shooting;Constant;Throbbing    Pain Type Chronic pain    Pain Onset More than a month ago    Pain Frequency Constant                 Manual tx.:     Gentle STM to L ankle/foot in supine position/ scar massage to L incision with vitamin E lotion.    Therex: L ankle and toe AA/PROM (all planes) in a pain tolerable range 20x each (muscle  guarded/ pain)  Supine L ankle IV/EV AROM (occasional PT assist) x15 Supine L ankle manual isometrics 5x: DF/PF/ IV/EV (pain tolerable) with 10 second holds.   Supine L ankle toe isometrics (extension/flexion) big toe separate, and toes 2-5 together 5x with 5 second holds Standing toe extension/ gastroc stretches at wall 5x10 second holds   Seated toe raises x10  pt response/clinical impression: Pt reported that her MD would like her to continue with physical therapy. Goals carried forward at this time, still appropriate and pt demonstrating improvement towards goals. Pt still reported pain with toe mobility especially with second toe. The patient would benefit from further skilled PT intervention to continue to progress towards goals and maximize PLOF and pain free function.      PT Education - 11/15/20 1657    Education Details therepeutic exercise/technique    Person(s) Educated Patient    Methods Explanation;Demonstration    Comprehension Verbalized understanding;Returned demonstration               PT Long Term Goals - 11/01/20 1551      PT LONG TERM GOAL #1   Title Pt. will increase  FOTO to 62 to improve functional mobility.    Baseline 12/1: initial FOTO 38.  12/28: 59 (marked improvement)- pain remains persistent.    Time 4    Period Weeks    Status Partially Met    Target Date 11/02/20      PT LONG TERM GOAL #2   Title Pt. will increase L ankle AROM as compared to R ankle to improve gait pattern.    Baseline Decrease L/R ankle AROM: DF: 0/4 deg., PF 44/58 deg., IV 22/30 deg., EV 0/ 8 deg.   12/28 (L ankle): DF 7 deg., PF: 55 deg., IV 26 deg., EV 16 deg. (R EV 23 deg.)    Time 4    Period Weeks    Status Partially Met    Target Date 11/02/20      PT LONG TERM GOAL #3   Title Pt. will report <5/10 L foot/ankle pain at rest to improve pain-free mobility.    Baseline Pt. reports 9-10/10 L foot pain.  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    Baseline high levels of L foot/ankle pain with house cleaning/ work-related tasks.    Time 4    Period Weeks    Status Not Met    Target Date 11/02/20                 Plan - 11/15/20 1658    Clinical Impression Statement Pt reported that her MD would like her to continue with physical therapy. Goals carried forward at this time, still appropriate and pt demonstrating improvement towards goals. Pt still reported pain with toe mobility especially with second toe. The patient would benefit from further skilled PT intervention to continue to progress towards goals and maximize PLOF and pain free function.    Personal Factors and Comorbidities Profession    Examination-Participation Restrictions Cleaning    Stability/Clinical Decision Making Evolving/Moderate complexity    Rehab Potential Fair    PT Frequency 2x / week    PT Duration 4 weeks    PT Treatment/Interventions ADLs/Self Care Home Management;Cryotherapy;Moist Heat;Iontophoresis 48m/ml Dexamethasone;Electrical Stimulation;Contrast Bath;Gait training;Stair training;Functional mobility training;Neuromuscular re-education;Therapeutic exercise;Therapeutic activities;Patient/family education;Balance training;Orthotic Fit/Training;Manual techniques;Scar mobilization;Passive range of motion;Dry needling    PT Next Visit Plan Pt. will contact PT after MD appt. to discuss [place]          Patient will benefit from skilled therapeutic intervention in order to improve the following deficits and impairments:  Abnormal gait,Decreased balance,Decreased endurance,Decreased mobility,Difficulty walking,Hypomobility,Decreased range of motion,Decreased activity tolerance,Decreased strength,Impaired flexibility,Pain  Visit Diagnosis: Pain in left foot  Joint  stiffness of left foot  Muscle weakness (generalized)  Gait difficulty     Problem List Patient Active Problem List   Diagnosis Date Noted  . Chronic hip pain (Bilateral) 11/14/2020  . Chronic knee pain (Left) 11/14/2020  . Enthesopathy of knee region (Left) 11/14/2020  . Chronic foot pain (Left) 11/14/2020  . Lumbar facet joint syndrome (Bilateral) 11/14/2020  . Chronic sacroiliac joint pain (Bilateral) 11/14/2020  . History foot surgery (Left) 11/14/2020    Class: History of  . Chronic pain syndrome 11/06/2020  . Pharmacologic therapy 11/06/2020  .  Disorder of skeletal system 11/06/2020  . Problems influencing health status 11/06/2020  . Temporary low platelet count ([place]) 12/03/2019  . Family history of breast cancer in mother 12/01/2019  . Generalized anxiety disorder 12/01/2019  . Chronic jaw pain 12/01/2019  . Acute conjunctivitis of left eye 05/30/2018  . Acute non-recurrent maxillary sinusitis 05/30/2018  . Class 2 obesity with body mass index (BMI) of 35.0 to 35.9 in adult 05/24/2018  . High risk medication use 05/24/2018  . Constipation 06/24/2017  . Cyst of ovary 06/24/2017  . Menopause present 06/24/2017  . Adnexal cyst 10/04/2016  . Dysuria 10/04/2016  . 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 Chronic pain of both shoulders 07/24/2016  . Family history of diabetes mellitus in first degree relative 07/24/2016  . Neck pain 07/24/2016  . Sciatica 07/24/2016  . Foot pain, right 03/25/2014  . Essential hypertension 03/25/2014  . Migraine headache 03/25/2014  . History of migraine 03/25/2014  . Internal hemorrhoids 12/31/2011  . Screen for colon cancer 12/17/2011    Lieutenant Diego PT, DPT 5:32 PM,11/15/20   [facility] [address] [place], [place], [address] Phone: [phone]   Fax:  [phone]  Name: Deborah Estrada MRN: [id number] Date of Birth: 06-14-1956

## 2020-11-16 NOTE — Addendum Note (Signed)
Addended by: Fabio Bering on: 11/16/2020 08:01 AM   Modules accepted: Orders

## 2020-11-18 LAB — COMPLIANCE DRUG ANALYSIS, UR

## 2020-11-19 LAB — COMP. METABOLIC PANEL (12)
AST: 13 IU/L (ref 0–40)
Albumin/Globulin Ratio: 1.6 (ref 1.2–2.2)
Albumin: 4.4 g/dL (ref 3.8–4.8)
Alkaline Phosphatase: 87 IU/L (ref 44–121)
BUN/Creatinine Ratio: 15 (ref 12–28)
BUN: 12 mg/dL (ref 8–27)
Bilirubin Total: 0.3 mg/dL (ref 0.0–1.2)
Calcium: 9.4 mg/dL (ref 8.7–10.3)
Chloride: 100 mmol/L (ref 96–106)
Creatinine, Ser: 0.81 mg/dL (ref 0.57–1.00)
GFR calc Af Amer: 88 mL/min/{1.73_m2} (ref >59)
GFR calc non Af Amer: 76 mL/min/{1.73_m2} (ref >59)
Globulin, Total: 2.8 g/dL (ref 1.5–4.5)
Glucose: 115 mg/dL — ABNORMAL HIGH (ref 65–99)
Potassium: 3.8 mmol/L (ref 3.5–5.2)
Sodium: 144 mmol/L (ref 134–144)
Total Protein: 7.2 g/dL (ref 6.0–8.5)

## 2020-11-19 LAB — MAGNESIUM: Magnesium: 2.2 mg/dL (ref 1.6–2.3)

## 2020-11-19 LAB — C-REACTIVE PROTEIN: CRP: 2 mg/L (ref 0–10)

## 2020-11-19 LAB — 25-HYDROXY VITAMIN D LCMS D2+D3
25-Hydroxy, Vitamin D-2: <1 ng/mL
25-Hydroxy, Vitamin D-3: 66 ng/mL
25-Hydroxy, Vitamin D: 66 ng/mL

## 2020-11-19 LAB — SEDIMENTATION RATE: Sed Rate: 21 mm/hr (ref 0–40)

## 2020-11-19 LAB — VITAMIN B12: Vitamin B-12: >2000 pg/mL — ABNORMAL HIGH (ref 232–1245)

## 2020-11-21 ENCOUNTER — Ambulatory Visit: Payer: Medicare HMO | Admitting: Physical Therapy

## 2020-11-24 ENCOUNTER — Ambulatory Visit: Payer: Medicare HMO | Admitting: Physical Therapy

## 2020-11-24 ENCOUNTER — Telehealth: Payer: Self-pay | Admitting: *Deleted

## 2020-11-24 NOTE — Telephone Encounter (Signed)
Patient called and said that she went to pain clinic but the guy there did not give her a good impression. "Something is wrong w/ my foot(sobbing)".I need a second opinion,have someone else look at it. Please call me in something pain.

## 2020-11-24 NOTE — Telephone Encounter (Signed)
That will have to be determined by Dr Al Corpus, she is not my primary patient

## 2020-11-28 ENCOUNTER — Ambulatory Visit: Payer: Medicare HMO | Admitting: Physical Therapy

## 2020-11-28 ENCOUNTER — Encounter: Payer: Self-pay | Admitting: Physical Therapy

## 2020-11-28 ENCOUNTER — Other Ambulatory Visit: Payer: Self-pay

## 2020-11-28 DIAGNOSIS — R269 Unspecified abnormalities of gait and mobility: Secondary | ICD-10-CM

## 2020-11-28 DIAGNOSIS — M79672 Pain in left foot: Secondary | ICD-10-CM | POA: Diagnosis not present

## 2020-11-28 DIAGNOSIS — M6281 Muscle weakness (generalized): Secondary | ICD-10-CM

## 2020-11-28 DIAGNOSIS — M25675 Stiffness of left foot, not elsewhere classified: Secondary | ICD-10-CM

## 2020-11-28 NOTE — Therapy (Signed)
[facility] [address] [place], [place], [address] Phone: [phone]   Fax:  [phone]  Physical Therapy Treatment  Patient Details  Name: Deborah Estrada MRN: [id number] Date of Birth: Oct 14, 1956 Referring Provider (PT): Dr. Lanae Crumbly   Encounter Date: 11/28/2020   PT End of Session - 11/28/20 0944    Visit Number 8    Number of Visits 16    Date for PT Re-Evaluation 12/13/20    Authorization - Visit Number 2    Authorization - Number of Visits 10    PT Start Time 0900    PT Stop Time 0943    PT Time Calculation (min) 43 min    Activity Tolerance Patient limited by pain    Behavior During Therapy [facility] for tasks assessed/performed           Past Medical History:  Diagnosis Date  . HBP (high blood pressure)     Past Surgical History:  Procedure Laterality Date  . CHOLECYSTECTOMY    . COLONOSCOPY      There were no vitals filed for this visit.   Subjective Assessment - 11/28/20 0903    Subjective Pt continues to report high levels of pain in her L foot, with the most pain being in the evening and at night which makes it hard to sleep. Pt reports no adverse effects after last PT appointment. 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  Pt. sleeps on back secondary to L knee/foot pain but prefers to sleep on R side.  Pt. wears New Balance shoes and has orthotics.    Patient Stated Goals Decrease pain in L foot    Currently in Pain? Yes    Pain Score 8     Pain Location Foot    Pain Orientation Left    Pain Onset More than a month ago           Manual tx.:     Gentle STM to L ankle/foot in supine position/ scar massage to L incision with vitamin E lotion.  **next visit- add L great toe joint mobs   Therex: L ankle and toe AROM (all planes) in a pain tolerable range 20x each  (muscle guarded/ pain)  Towel toe crunch 1 minute  Inv/ever towel slides 1 minute Seated HR/TR 2x1 minute  Great toe self stretch 3x30 sec      Added great toe PROM and self PF massage to HEP.        PT Long Term Goals - 11/28/20 1904      PT LONG TERM GOAL #1   Title Pt. will increase FOTO to 62 to improve functional mobility.    Baseline 12/1: initial FOTO 38.  12/28: 59 (marked improvement)- pain remains persistent.    Time 4    Period Weeks    Status Partially Met    Target Date 12/13/20      PT LONG TERM GOAL #2   Title Pt. will increase L ankle AROM as compared to R ankle to improve gait pattern.    Baseline Decrease L/R ankle AROM: DF: 0/4 deg., PF 44/58 deg., IV 22/30 deg., EV 0/ 8 deg.   12/28 (L ankle): DF 7 deg., PF: 55 deg., IV 26 deg., EV 16 deg. (R EV 23 deg.)    Time 4    Period  Weeks    Status Partially Met    Target Date 12/13/20      PT LONG TERM GOAL #3   Title Pt. will report <5/10 L foot/ankle pain at rest to improve pain-free mobility.    Baseline Pt. reports 9-10/10 L foot pain.  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    Baseline high levels of L foot/ankle pain with house cleaning/ work-related tasks.    Time 4    Period Weeks    Status Not Met    Target Date 12/13/20                 Plan - 11/28/20 0945    Clinical Impression Statement Pt continues to present with decreased L great toe ROM, which impacts pts ability to amb with normal mechanics. Pain continues to limit pts ability to tolerate manual and exercises.    Personal Factors and Comorbidities Profession    Examination-Participation Restrictions Cleaning    Stability/Clinical Decision Making Evolving/Moderate complexity    Clinical Decision Making Moderate    Rehab Potential Fair    PT Frequency  2x / week    PT Duration 4 weeks    PT Treatment/Interventions ADLs/Self Care Home Management;Cryotherapy;Moist Heat;Iontophoresis 3m/ml Dexamethasone;Electrical Stimulation;Contrast Bath;Gait training;Stair training;Functional mobility training;Neuromuscular re-education;Therapeutic exercise;Therapeutic activities;Patient/family education;Balance training;Orthotic Fit/Training;Manual techniques;Scar mobilization;Passive range of motion;Dry needling    PT Next Visit Plan Attempt great toe joint mobs grades 1-3 next visit for pain control and improved ROM    PT Home Exercise Plan [email or web address]           Patient will benefit from skilled therapeutic intervention in order to improve the following deficits and impairments:  Abnormal gait,Decreased balance,Decreased endurance,Decreased mobility,Difficulty walking,Hypomobility,Decreased range of motion,Decreased activity tolerance,Decreased strength,Impaired flexibility,Pain  Visit Diagnosis: Pain in left foot  Joint stiffness of left foot  Muscle weakness (generalized)  Gait difficulty     Problem List Patient Active Problem List   Diagnosis Date Noted  . Chronic hip pain (Bilateral) 11/14/2020  . Chronic knee pain (Left) 11/14/2020  . Enthesopathy of knee region (Left) 11/14/2020  . Chronic foot pain (Left) 11/14/2020  . Lumbar facet joint syndrome (Bilateral) 11/14/2020  . Chronic sacroiliac joint pain (Bilateral) 11/14/2020  . History foot surgery (Left) 11/14/2020    Class: History of  . Chronic pain syndrome 11/06/2020  . Pharmacologic therapy 11/06/2020  . Disorder of skeletal system 11/06/2020  . Problems influencing health status 11/06/2020  . Temporary low platelet count ([place] 12/03/2019  . Family history of breast cancer in mother 12/01/2019  . Generalized anxiety disorder 12/01/2019  . Chronic jaw pain 12/01/2019  . Acute conjunctivitis of left eye 05/30/2018  . Acute non-recurrent maxillary sinusitis 05/30/2018  .  Class 2 obesity with body mass index (BMI) of 35.0 to 35.9 in adult 05/24/2018  . High risk medication use 05/24/2018  . Constipation 06/24/2017  . Cyst of ovary 06/24/2017  . Menopause present 06/24/2017  . Adnexal cyst 10/04/2016  . Dysuria 10/04/2016  . 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 Chronic pain of both shoulders 07/24/2016  . Family history of diabetes mellitus in first degree relative 07/24/2016  . Neck pain 07/24/2016  .  Sciatica 07/24/2016  . Foot pain, right 03/25/2014  . Essential hypertension 03/25/2014  . Migraine headache 03/25/2014  . History of migraine 03/25/2014  . Internal hemorrhoids 12/31/2011  . Screen for colon cancer 12/17/2011   Kayleen Memos SPT Pura Spice, PT, DPT # [phone] 11/28/2020, 7:06 PM  [facility] [address] [place], [place], [address] Phone: [phone]   Fax:  [phone]  Name: Deborah Estrada MRN: [id number] Date of Birth: 12/24/1955

## 2020-11-28 NOTE — Patient Instructions (Signed)
Access Code: [email or web address]: [email or web address]: 01/24/2022Prepared by: Casimiro Needle SherkExercises  Seated Toe Flexion Extension PROM - 1 x daily - 7 x weekly - 3 sets - 10 reps

## 2020-11-30 NOTE — Telephone Encounter (Signed)
Send to dr Ether Griffins for second opinion at [place].

## 2020-12-01 ENCOUNTER — Other Ambulatory Visit: Payer: Self-pay | Admitting: Podiatry

## 2020-12-02 NOTE — Telephone Encounter (Signed)
Patient called left voice mail upset because she is in a lot of pain with her foot and she is requesting some pain medication.   I will work on her referral for a second opinion.  Please advise

## 2020-12-06 ENCOUNTER — Ambulatory Visit: Payer: Medicare HMO | Attending: Podiatry

## 2020-12-06 ENCOUNTER — Other Ambulatory Visit: Payer: Self-pay

## 2020-12-06 DIAGNOSIS — M25675 Stiffness of left foot, not elsewhere classified: Secondary | ICD-10-CM | POA: Insufficient documentation

## 2020-12-06 DIAGNOSIS — M6281 Muscle weakness (generalized): Secondary | ICD-10-CM | POA: Diagnosis present

## 2020-12-06 DIAGNOSIS — M79672 Pain in left foot: Secondary | ICD-10-CM | POA: Insufficient documentation

## 2020-12-06 DIAGNOSIS — R269 Unspecified abnormalities of gait and mobility: Secondary | ICD-10-CM | POA: Insufficient documentation

## 2020-12-06 NOTE — Therapy (Signed)
[facility] [address] [place], [place], [address] Phone: [phone]   Fax:  [phone]  Physical Therapy Treatment  Patient Details  Name: Deborah Estrada MRN: [id number] Date of Birth: Jun 14, 1956 Referring Provider (PT): Dr. Lanae Crumbly   Encounter Date: 12/06/2020   PT End of Session - 12/06/20 1529    Visit Number 9    Number of Visits 16    Date for PT Re-Evaluation 12/13/20    Authorization - Visit Number 3    Authorization - Number of Visits 10    PT Start Time 9150    PT Stop Time 1600    PT Time Calculation (min) 39 min    Activity Tolerance Patient limited by pain    Behavior During Therapy [facility] for tasks assessed/performed           Past Medical History:  Diagnosis Date  . HBP (high blood pressure)     Past Surgical History:  Procedure Laterality Date  . CHOLECYSTECTOMY    . COLONOSCOPY      There were no vitals filed for this visit.   Subjective Assessment - 12/06/20 1523    Subjective Pt reports that she continues to have high levels of pain throughout her toes, mid foot, and arch, espcially when she is working and when she is trying to sleep. Pt states she has been doing her HEP, but her toes still feel really painful and stiff. Pt also continues to report intense pain at the nodules that are in her R arch.    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  Pt. sleeps on back secondary to L knee/foot pain but prefers to sleep on R side.  Pt. wears New Balance shoes and has orthotics.    Patient Stated Goals Decrease pain in L foot    Currently in Pain? Yes    Pain Score 7     Pain Location Foot    Pain Orientation Left    Pain Descriptors / Indicators Burning    Pain Type Chronic pain    Pain Onset More than a month ago           TREATMENT:   Manual tx.:   Gentle STM to L ankle/foot in supine position/ scar massage to L incision  with vitamin E lotion. Added posterior and anterior joint mobs of the L great toe MTP joint grade II-III today.   Therex: L ankle circles   Arch lifts x5  Towel toe crunch 1 minute  Inv/ever towel slides 1 minute Seated HR/TR 2x1 minute  Great toe self stretch 3x30 sec  Education provided about shoe inserts. Instructed to attempt ambulation in shoes at home without inserts in to assess response. Also educated on potential benefit of podiatry evaluation to assess proper orthotic fit and need s/p surgery, pt verbalized understanding.        PT Long Term Goals - 11/28/20 1904      PT LONG TERM GOAL #1   Title Pt. will increase FOTO to 62 to improve functional mobility.    Baseline 12/1: initial FOTO 38.  12/28: 59 (marked improvement)- pain remains persistent.    Time 4    Period Weeks    Status Partially Met    Target Date 12/13/20      PT LONG TERM GOAL #2   Title Pt. will increase L ankle AROM as compared to R ankle to  improve gait pattern.    Baseline Decrease L/R ankle AROM: DF: 0/4 deg., PF 44/58 deg., IV 22/30 deg., EV 0/ 8 deg.   12/28 (L ankle): DF 7 deg., PF: 55 deg., IV 26 deg., EV 16 deg. (R EV 23 deg.)    Time 4    Period Weeks    Status Partially Met    Target Date 12/13/20      PT LONG TERM GOAL #3   Title Pt. will report <5/10 L foot/ankle pain at rest to improve pain-free mobility.    Baseline Pt. reports 9-10/10 L foot pain.  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    Baseline high levels of L foot/ankle pain with house cleaning/ work-related tasks.    Time 4    Period Weeks    Status Not Met    Target Date 12/13/20                 Plan - 12/06/20 1529    Clinical Impression Statement High pain levels continue to limit pts tolerance to manual and exercises.  Nodules are present evident during manual treatment and are TTP. Added great toe joint mobs to manual today and pt reported increased pain with grades II-III. With increased pain, mobs grades were decreased. Pt needed tactile and verbal cueing for exercise technique to maximize ROM and effort. The patient would benefit from further skilled PT intervention to progress towards goals as able.    Personal Factors and Comorbidities Profession    Examination-Participation Restrictions Cleaning    Stability/Clinical Decision Making Evolving/Moderate complexity    Clinical Decision Making Moderate    Rehab Potential Fair    PT Frequency 2x / week    PT Duration 4 weeks    PT Treatment/Interventions ADLs/Self Care Home Management;Cryotherapy;Moist Heat;Iontophoresis 77m/ml Dexamethasone;Electrical Stimulation;Contrast Bath;Gait training;Stair training;Functional mobility training;Neuromuscular re-education;Therapeutic exercise;Therapeutic activities;Patient/family education;Balance training;Orthotic Fit/Training;Manual techniques;Scar mobilization;Passive range of motion;Dry needling    PT Next Visit Plan Assess pt tolerance to added joint mobs and continues to attempt decreasing pain to ease WB tasks    PT Home Exercise Plan [place]          Patient will benefit from skilled therapeutic intervention in order to improve the following deficits and impairments:  Abnormal gait,Decreased balance,Decreased endurance,Decreased mobility,Difficulty walking,Hypomobility,Decreased range of motion,Decreased activity tolerance,Decreased strength,Impaired flexibility,Pain  Visit Diagnosis: Pain in left foot  Joint stiffness of left foot  Muscle weakness (generalized)  Gait difficulty     Problem List Patient Active Problem List   Diagnosis Date Noted  . Chronic hip pain (Bilateral) 11/14/2020  . Chronic knee pain (Left) 11/14/2020  . Enthesopathy of knee region (Left) 11/14/2020  . Chronic foot pain  (Left) 11/14/2020  . Lumbar facet joint syndrome (Bilateral) 11/14/2020  . Chronic sacroiliac joint pain (Bilateral) 11/14/2020  . History foot surgery (Left) 11/14/2020    Class: History of  . Chronic pain syndrome 11/06/2020  . Pharmacologic therapy 11/06/2020  . Disorder of skeletal system 11/06/2020  . Problems influencing health status 11/06/2020  . Temporary low platelet count ([place] 12/03/2019  . Family history of breast cancer in mother 12/01/2019  . Generalized anxiety disorder 12/01/2019  . Chronic jaw pain 12/01/2019  .  Acute conjunctivitis of left eye 05/30/2018  . Acute non-recurrent maxillary sinusitis 05/30/2018  . Class 2 obesity with body mass index (BMI) of 35.0 to 35.9 in adult 05/24/2018  . High risk medication use 05/24/2018  . Constipation 06/24/2017  . Cyst of ovary 06/24/2017  . Menopause present 06/24/2017  . Adnexal cyst 10/04/2016  . Dysuria 10/04/2016  . 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 Chronic pain of both shoulders 07/24/2016  . Family history of diabetes mellitus in first degree relative 07/24/2016  . Neck pain 07/24/2016  . Sciatica 07/24/2016  . Foot pain, right 03/25/2014  . Essential hypertension 03/25/2014  . Migraine headache 03/25/2014  . History of migraine 03/25/2014  . Internal hemorrhoids 12/31/2011  . Screen for colon cancer 12/17/2011    Kayleen Memos 12/06/2020, 4:12 PM  [facility] [address] [place], [place], [address] Phone: [phone]   Fax:  [phone]  Name: Deborah Estrada MRN: [id number] Date of Birth: 1956-08-07

## 2020-12-08 ENCOUNTER — Encounter: Payer: Self-pay | Admitting: Physical Therapy

## 2020-12-08 ENCOUNTER — Ambulatory Visit: Payer: Medicare HMO | Admitting: Physical Therapy

## 2020-12-08 ENCOUNTER — Other Ambulatory Visit: Payer: Self-pay

## 2020-12-08 DIAGNOSIS — R269 Unspecified abnormalities of gait and mobility: Secondary | ICD-10-CM

## 2020-12-08 DIAGNOSIS — M79672 Pain in left foot: Secondary | ICD-10-CM | POA: Diagnosis not present

## 2020-12-08 DIAGNOSIS — M6281 Muscle weakness (generalized): Secondary | ICD-10-CM

## 2020-12-08 DIAGNOSIS — M25675 Stiffness of left foot, not elsewhere classified: Secondary | ICD-10-CM

## 2020-12-11 NOTE — Therapy (Signed)
[facility] [address] [place], [place], [address] Phone: [phone]   Fax:  [phone]  Physical Therapy Treatment Physical Therapy Progress Note Dates of reporting period  10/05/2020  to   12/08/2020  Patient Details  Name: Deborah Estrada MRN: [id number] Date of Birth: 06-15-1956 Referring Provider (PT): Dr. Lanae Crumbly   Encounter Date: 12/08/2020   PT End of Session - 12/11/20 1050    Visit Number 10    Number of Visits 16    Date for PT Re-Evaluation 12/13/20    Authorization - Visit Number 10    Authorization - Number of Visits 10    PT Start Time 6468    PT Stop Time 1725    PT Time Calculation (min) 47 min    Activity Tolerance Patient limited by pain    Behavior During Therapy [facility] for tasks assessed/performed           Past Medical History:  Diagnosis Date  . HBP (high blood pressure)     Past Surgical History:  Procedure Laterality Date  . CHOLECYSTECTOMY    . COLONOSCOPY      There were no vitals filed for this visit.   Subjective Assessment - 12/11/20 1044    Subjective Pt. states she continues to hurt in L foot and c/o >8/10 pain at rest.  Pt. states her foot/toes will get really stiff/ sore at night and PT discussed stretches/ STM.  Pt. is planning to set up a 3rd opinion MD appt. soon to discuss pain/ options.    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  Pt. sleeps on back secondary to L knee/foot pain but prefers to sleep on R side.  Pt. wears New Balance shoes and has orthotics.    Patient Stated Goals Decrease pain in L foot    Currently in Pain? Yes    Pain Score 8     Pain Location Foot    Pain Orientation Left    Pain Onset More than a month ago              Manual tx.:   Gentle STM to L ankle/foot in supine position/ scar massage to L incision with vitamin E lotion.  Supine L ankle AA/PROM (all planes)- increase  static holds.  Pain limited.  Supine great toe PROM and self PF massage to HEP.   STM to L plantar aspect of foot in supine  Reviewed HEP/ stretches.      PT Long Term Goals - 12/11/20 1104      PT LONG TERM GOAL #1   Title Pt. will increase FOTO to 62 to improve functional mobility.    Baseline 12/1: initial FOTO 38.  12/28: 59 (marked improvement)- pain remains persistent.    Time 4    Period Weeks    Status Partially Met    Target Date 12/13/20      PT LONG TERM GOAL #2   Title Pt. will increase L ankle AROM as compared to R ankle to improve gait pattern.    Baseline Decrease L/R ankle AROM: DF: 0/4 deg., PF 44/58 deg., IV 22/30 deg., EV 0/ 8 deg.   12/28 (L ankle): DF 7 deg., PF: 55 deg., IV 26 deg., EV 16 deg. (R EV 23 deg.)    Time 4    Period Weeks    Status Partially Met    Target Date 12/13/20  PT LONG TERM GOAL #3   Title Pt. will report <5/10 L foot/ankle pain at rest to improve pain-free mobility.    Baseline Pt. reports 9-10/10 L foot pain.  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    Baseline high levels of L foot/ankle pain with house cleaning/ work-related tasks.    Time 4    Period Weeks    Status Not Met    Target Date 12/13/20                 Plan - 12/11/20 1051    Clinical Impression Statement Pts. L ankle/ toe ROM remains limited by high levels of pain during manual tx./ ther.ex.  Pt. unable to tolerate L great toe mobs today.  PT focused on increasing ankle/toe ROM with static holds (increase time).  STM to L ankle/ plantar aspect of foot to decrease pain.  No improvement noted in nodules on plantar aspect of foot.    Personal Factors and Comorbidities Profession    Examination-Participation Restrictions Cleaning    Stability/Clinical Decision Making  Evolving/Moderate complexity    Clinical Decision Making Moderate    Rehab Potential Fair    PT Frequency 2x / week    PT Duration 4 weeks    PT Treatment/Interventions ADLs/Self Care Home Management;Cryotherapy;Moist Heat;Iontophoresis 9m/ml Dexamethasone;Electrical Stimulation;Contrast Bath;Gait training;Stair training;Functional mobility training;Neuromuscular re-education;Therapeutic exercise;Therapeutic activities;Patient/family education;Balance training;Orthotic Fit/Training;Manual techniques;Scar mobilization;Passive range of motion;Dry needling    PT Next Visit Plan Assess pt tolerance to added joint mobs and continues to attempt decreasing pain to ease WB tasks.   Pt. has 1 more appt. scheduled.  Discuss MD f/u appt.    PT [place]          Patient will benefit from skilled therapeutic intervention in order to improve the following deficits and impairments:  Abnormal gait,Decreased balance,Decreased endurance,Decreased mobility,Difficulty walking,Hypomobility,Decreased range of motion,Decreased activity tolerance,Decreased strength,Impaired flexibility,Pain  Visit Diagnosis: Pain in left foot  Joint stiffness of left foot  Muscle weakness (generalized)  Gait difficulty     Problem List Patient Active Problem List   Diagnosis Date Noted  . Chronic hip pain (Bilateral) 11/14/2020  . Chronic knee pain (Left) 11/14/2020  . Enthesopathy of knee region (Left) 11/14/2020  . Chronic foot pain (Left) 11/14/2020  . Lumbar facet joint syndrome (Bilateral) 11/14/2020  . Chronic sacroiliac joint pain (Bilateral) 11/14/2020  . History foot surgery (Left) 11/14/2020    Class: History of  . Chronic pain syndrome 11/06/2020  . Pharmacologic therapy 11/06/2020  . Disorder of skeletal system 11/06/2020  . Problems influencing health status 11/06/2020  . Temporary low platelet count ([place] 12/03/2019  . Family history of breast cancer in mother 12/01/2019  .  Generalized anxiety disorder 12/01/2019  . Chronic jaw pain 12/01/2019  . Acute conjunctivitis of left eye 05/30/2018  . Acute non-recurrent maxillary sinusitis 05/30/2018  . Class 2 obesity with body mass index (BMI) of 35.0 to 35.9 in adult 05/24/2018  . High risk medication use 05/24/2018  . Constipation 06/24/2017  . Cyst of ovary 06/24/2017  . Menopause present 06/24/2017  . Adnexal cyst 10/04/2016  . Dysuria 10/04/2016  . Chronic low back pain (Bilateral) w/ sciatica (  Left) 07/24/2016  . Chronic pain of both shoulders 07/24/2016  . Family history of diabetes mellitus in first degree relative 07/24/2016  . Neck pain 07/24/2016  . Sciatica 07/24/2016  . Foot pain, right 03/25/2014  . Essential hypertension 03/25/2014  . Migraine headache 03/25/2014  . History of migraine 03/25/2014  . Internal hemorrhoids 12/31/2011  . Screen for colon cancer 12/17/2011   Pura Spice, PT, DPT # [phone] 12/11/2020, 11:05 AM  [place] [facility] [address] [place], [place], [address] Phone: [phone]   Fax:  [phone]  Name: Sevilla Murtagh MRN: [id number] Date of Birth: 1956-01-05

## 2020-12-15 ENCOUNTER — Ambulatory Visit: Payer: Medicare HMO | Admitting: Physical Therapy

## 2020-12-15 ENCOUNTER — Other Ambulatory Visit: Payer: Self-pay

## 2020-12-15 DIAGNOSIS — M79672 Pain in left foot: Secondary | ICD-10-CM

## 2020-12-15 DIAGNOSIS — R269 Unspecified abnormalities of gait and mobility: Secondary | ICD-10-CM

## 2020-12-15 DIAGNOSIS — M25675 Stiffness of left foot, not elsewhere classified: Secondary | ICD-10-CM

## 2020-12-15 DIAGNOSIS — M6281 Muscle weakness (generalized): Secondary | ICD-10-CM

## 2020-12-17 ENCOUNTER — Encounter: Payer: Self-pay | Admitting: Physical Therapy

## 2020-12-17 NOTE — Therapy (Signed)
[facility] [address] [place], [place], [address] Phone: [phone]   Fax:  [phone]  Physical Therapy Treatment  Patient Details  Name: Deborah Estrada MRN: [id number] Date of Birth: 12/24/1955 Referring Provider (PT): Dr. Lanae Crumbly   Encounter Date: 12/15/2020   PT End of Session - 12/17/20 1142    Visit Number 11    Number of Visits 19    Date for PT Re-Evaluation 01/12/21    Authorization - Visit Number 1    Authorization - Number of Visits 10    PT Start Time 1941    PT Stop Time 1725    PT Time Calculation (min) 43 min    Activity Tolerance Patient limited by pain    Behavior During Therapy [facility] for tasks assessed/performed           Past Medical History:  Diagnosis Date  . HBP (high blood pressure)     Past Surgical History:  Procedure Laterality Date  . CHOLECYSTECTOMY    . COLONOSCOPY      There were no vitals filed for this visit.   Subjective Assessment - 12/17/20 1141    Subjective L foot pain remains constant and >8/10, esp. after work/ night.  Pt. continues to work full-time in housekeeping.  Difficulty climbing stairs while carrying vacuum/ cleaning supplies.    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  Pt. sleeps on back secondary to L knee/foot pain but prefers to sleep on R side.  Pt. wears New Balance shoes and has orthotics.    Patient Stated Goals Decrease pain in L foot    Currently in Pain? Yes    Pain Score 8     Pain Location Foot    Pain Orientation Left    Pain Descriptors / Indicators Burning    Pain Onset More than a month ago              [place] PT Assessment - 12/17/20 0001      Assessment   Medical Diagnosis L foot pain    Referring Provider (PT) Dr. Lanae Crumbly    Onset Date/Surgical Date 07/01/20      Home Environment   Living Environment Private residence      Prior Function   Level of  Independence Independent            [place] to L plantar aspect of foot (over nodules during toe extension)- 1.6 w/cm2 at 50% duty cycle 8 min.     Manual tx.:   Gentle STM to L ankle/foot in supine position/ scar massage to L incision with vitamin E lotion.  Supine L ankle AA/PROM (all planes)- increase static holds.  Pain limited.  Supine great toe PROM and self PF massage to HEP.  STM to L plantar aspect of foot in supine  Reviewed HEP/ stretches.      PT Long Term Goals - 12/17/20 1158      PT LONG TERM GOAL #1   Title Pt. will increase FOTO to 62 to improve functional mobility.    Baseline 12/1: initial FOTO 38.  12/28: 59 (marked improvement)- pain remains persistent.    Time 4    Period Weeks    Status Partially Met    Target Date 01/12/21      PT LONG TERM GOAL #2   Title Pt. will increase L ankle AROM as compared to R ankle to  improve gait pattern.    Baseline Decrease L/R ankle AROM: DF: 0/4 deg., PF 44/58 deg., IV 22/30 deg., EV 0/ 8 deg.   12/28 (L ankle): DF 7 deg., PF: 55 deg., IV 26 deg., EV 16 deg. (R EV 23 deg.)    Time 4    Period Weeks    Status Partially Met    Target Date 01/12/21      PT LONG TERM GOAL #3   Title Pt. will report <5/10 L foot/ankle pain at rest to improve pain-free mobility.    Baseline Pt. reports 9-10/10 L foot pain.  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    Baseline high levels of L foot/ankle pain with house cleaning/ work-related tasks.    Time 4    Period Weeks    Status Not Met    Target Date 01/12/21                 Plan - 12/17/20 1149    Clinical Impression Statement Pt. continues to be limited by high levels of pain with poor tolerance to manual tx. and exercises. 2 nodules remain on plantar aspect of foot and remain TTP.   Increase L ankle AROM noted and slight increaes in L great toe extension but marked increase in pain persists.  Pt needed tactile and verbal cueing for exercise technique to maximize ROM and effort.  See updated goals. The patient would benefit from further skilled PT intervention to progress towards goals as able.    Personal Factors and Comorbidities Profession    Examination-Participation Restrictions Cleaning    Stability/Clinical Decision Making Evolving/Moderate complexity    Clinical Decision Making Moderate    Rehab Potential Fair    PT Frequency 2x / week    PT Duration 4 weeks    PT Treatment/Interventions ADLs/Self Care Home Management;Cryotherapy;Moist Heat;Iontophoresis 32m/ml Dexamethasone;Electrical Stimulation;Contrast Bath;Gait training;Stair training;Functional mobility training;Neuromuscular re-education;Therapeutic exercise;Therapeutic activities;Patient/family education;Balance training;Orthotic Fit/Training;Manual techniques;Scar mobilization;Passive range of motion;Dry needling    PT Next Visit Plan Pt. is planning to schedule a 2nd opinion MD visit with Dr. PDonnie Mesa    PT [place]          Patient will benefit from skilled therapeutic intervention in order to improve the following deficits and impairments:  Abnormal gait,Decreased balance,Decreased endurance,Decreased mobility,Difficulty walking,Hypomobility,Decreased range of motion,Decreased activity tolerance,Decreased strength,Impaired flexibility,Pain  Visit Diagnosis: Pain in left foot  Joint stiffness of left foot  Muscle weakness (generalized)  Gait difficulty     Problem List Patient Active Problem List   Diagnosis Date Noted  . Chronic hip pain (Bilateral) 11/14/2020  . Chronic knee pain (Left) 11/14/2020  . Enthesopathy of knee region (Left) 11/14/2020  . Chronic foot pain (Left) 11/14/2020  . Lumbar facet joint syndrome (Bilateral) 11/14/2020  . Chronic sacroiliac joint  pain (Bilateral) 11/14/2020  . History foot surgery (Left) 11/14/2020    Class: History of  . Chronic pain syndrome 11/06/2020  . Pharmacologic therapy 11/06/2020  . Disorder of skeletal system 11/06/2020  . Problems influencing health status 11/06/2020  . Temporary low platelet count ([place] 12/03/2019  . Family history of breast cancer in mother 12/01/2019  . Generalized anxiety disorder 12/01/2019  .  Chronic jaw pain 12/01/2019  . Acute conjunctivitis of left eye 05/30/2018  . Acute non-recurrent maxillary sinusitis 05/30/2018  . Class 2 obesity with body mass index (BMI) of 35.0 to 35.9 in adult 05/24/2018  . High risk medication use 05/24/2018  . Constipation 06/24/2017  . Cyst of ovary 06/24/2017  . Menopause present 06/24/2017  . Adnexal cyst 10/04/2016  . Dysuria 10/04/2016  . 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 Chronic pain of both shoulders 07/24/2016  . Family history of diabetes mellitus in first degree relative 07/24/2016  . Neck pain 07/24/2016  . Sciatica 07/24/2016  . Foot pain, right 03/25/2014  . Essential hypertension 03/25/2014  . Migraine headache 03/25/2014  . History of migraine 03/25/2014  . Internal hemorrhoids 12/31/2011  . Screen for colon cancer 12/17/2011   Pura Spice, PT, DPT # [phone] 12/17/2020, 12:03 PM  [facility] [address] [place], [place], [address] Phone: [phone]   Fax:  [phone]  Name: Dwanda Tufano MRN: [id number] Date of Birth: 12/19/1955

## 2020-12-19 ENCOUNTER — Ambulatory Visit: Payer: Medicare HMO | Admitting: Physical Therapy

## 2020-12-19 ENCOUNTER — Other Ambulatory Visit: Payer: Self-pay

## 2020-12-19 ENCOUNTER — Encounter: Payer: Self-pay | Admitting: Physical Therapy

## 2020-12-19 DIAGNOSIS — M25675 Stiffness of left foot, not elsewhere classified: Secondary | ICD-10-CM

## 2020-12-19 DIAGNOSIS — M79672 Pain in left foot: Secondary | ICD-10-CM | POA: Diagnosis not present

## 2020-12-19 DIAGNOSIS — R269 Unspecified abnormalities of gait and mobility: Secondary | ICD-10-CM

## 2020-12-19 DIAGNOSIS — M6281 Muscle weakness (generalized): Secondary | ICD-10-CM

## 2020-12-19 NOTE — Therapy (Signed)
[place] [facility] [address] [place], [place], [address] Phone: [phone]   Fax:  [phone]  Physical Therapy Treatment  Patient Details  Name: Deborah Estrada MRN: [id number] Date of Birth: 08-21-1956 Referring Provider (PT): Dr. Lanae Crumbly   Encounter Date: 12/19/2020   PT End of Session - 12/19/20 1851    Visit Number 12    Number of Visits 19    Date for PT Re-Evaluation 01/12/21    Authorization - Visit Number 2    Authorization - Number of Visits 10    PT Start Time 0849    PT Stop Time 0923    PT Time Calculation (min) 34 min    Activity Tolerance Patient limited by pain    Behavior During Therapy [facility] for tasks assessed/performed           Past Medical History:  Diagnosis Date  . HBP (high blood pressure)     Past Surgical History:  Procedure Laterality Date  . CHOLECYSTECTOMY    . COLONOSCOPY      There were no vitals filed for this visit.   Subjective Assessment - 12/19/20 1842    Subjective Pt. attends PT session in AM today to assess L foot prior to work day.  Pts. presents with continued c/o persistent L foot pain with no redness or swelling noted.  L plantar foot nodules remain.    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  Pt. sleeps on back secondary to L knee/foot pain but prefers to sleep on R side.  Pt. wears New Balance shoes and has orthotics.    Patient Stated Goals Decrease pain in L foot    Currently in Pain? Yes    Pain Score 8     Pain Location Foot    Pain Orientation Left    Pain Onset More than a month ago              [place] to L plantar aspect of foot (over nodules during toe extension)- 1.6 w/cm2 at 29% duty cycle 8 min.    Manual tx.:    Gentle STM to L ankle/foot in supine position/ manual isometrics to L ankle 5x all 4-planes in pain tolerable range.   Supine L ankle AA/PROM (all planes)- increase static  holds. Pain limited.  Supine great toe PROM flexion/ extension 10x.   STM to L plantar aspect of foot in supine      PT Long Term Goals - 12/17/20 1158      PT LONG TERM GOAL #1   Title Pt. will increase FOTO to 62 to improve functional mobility.    Baseline 12/1: initial FOTO 38.  12/28: 59 (marked improvement)- pain remains persistent.    Time 4    Period Weeks    Status Partially Met    Target Date 01/12/21      PT LONG TERM GOAL #2   Title Pt. will increase L ankle AROM as compared to R ankle to improve gait pattern.    Baseline Decrease L/R ankle AROM: DF: 0/4 deg., PF 44/58 deg., IV 22/30 deg., EV 0/ 8 deg.   12/28 (L ankle): DF 7 deg., PF: 55 deg., IV 26 deg., EV 16 deg. (R EV 23 deg.)    Time 4    Period Weeks    Status Partially Met    Target Date 01/12/21      PT LONG  TERM GOAL #3   Title Pt. will report <5/10 L foot/ankle pain at rest to improve pain-free mobility.    Baseline Pt. reports 9-10/10 L foot pain.  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    Baseline high levels of L foot/ankle pain with house cleaning/ work-related tasks.    Time 4    Period Weeks    Status Not Met    Target Date 01/12/21                 Plan - 12/19/20 1852    Clinical Impression Statement L plantar nodules remain significant/ TTP prior to work day.  Pt. ambulates with moderate L antalgic gait due to limited heel strike/ toe off during gait pattern.  Pt. scheduled a 2nd opinion f/u at end of March.  Pt. will continue with skilled PT services 1x/week to increase L ankle/foot AROM and stability prior to MD f/u.  Pt. has difficulty tolerating STM to L plantar aspect of foot and great toe extension due to pain.    Personal Factors and Comorbidities Profession    Examination-Participation  Restrictions Cleaning    Stability/Clinical Decision Making Evolving/Moderate complexity    Clinical Decision Making Moderate    Rehab Potential Fair    PT Frequency 2x / week    PT Duration 4 weeks    PT Treatment/Interventions ADLs/Self Care Home Management;Cryotherapy;Moist Heat;Iontophoresis 65m/ml Dexamethasone;Electrical Stimulation;Contrast Bath;Gait training;Stair training;Functional mobility training;Neuromuscular re-education;Therapeutic exercise;Therapeutic activities;Patient/family education;Balance training;Orthotic Fit/Training;Manual techniques;Scar mobilization;Passive range of motion;Dry needling    PT Next Visit Plan Progress L ankle AROM and stability.    PT [place]          Patient will benefit from skilled therapeutic intervention in order to improve the following deficits and impairments:  Abnormal gait,Decreased balance,Decreased endurance,Decreased mobility,Difficulty walking,Hypomobility,Decreased range of motion,Decreased activity tolerance,Decreased strength,Impaired flexibility,Pain  Visit Diagnosis: Pain in left foot  Joint stiffness of left foot  Muscle weakness (generalized)  Gait difficulty     Problem List Patient Active Problem List   Diagnosis Date Noted  . Chronic hip pain (Bilateral) 11/14/2020  . Chronic knee pain (Left) 11/14/2020  . Enthesopathy of knee region (Left) 11/14/2020  . Chronic foot pain (Left) 11/14/2020  . Lumbar facet joint syndrome (Bilateral) 11/14/2020  . Chronic sacroiliac joint pain (Bilateral) 11/14/2020  . History foot surgery (Left) 11/14/2020    Class: History of  . Chronic pain syndrome 11/06/2020  . Pharmacologic therapy 11/06/2020  . Disorder of skeletal system 11/06/2020  . Problems influencing health status 11/06/2020  . Temporary low platelet count ([place] 12/03/2019  . Family history of breast cancer in mother 12/01/2019  . Generalized anxiety disorder 12/01/2019  . Chronic jaw pain  12/01/2019  . Acute conjunctivitis of left eye 05/30/2018  . Acute non-recurrent maxillary sinusitis 05/30/2018  . Class 2 obesity with body mass index (BMI) of 35.0 to 35.9 in adult 05/24/2018  . High risk medication use 05/24/2018  . Constipation 06/24/2017  . Cyst of ovary 06/24/2017  . Menopause present 06/24/2017  . Adnexal cyst 10/04/2016  . Dysuria 10/04/2016  . 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 Chronic pain of both shoulders  07/24/2016  . Family history of diabetes mellitus in first degree relative 07/24/2016  . Neck pain 07/24/2016  . Sciatica 07/24/2016  . Foot pain, right 03/25/2014  . Essential hypertension 03/25/2014  . Migraine headache 03/25/2014  . History of migraine 03/25/2014  . Internal hemorrhoids 12/31/2011  . Screen for colon cancer 12/17/2011   Pura Spice, PT, DPT # [phone] 12/19/2020, 6:57 PM  [place] [address] [place], [place], [address] Phone: [phone]   Fax:  [phone]  Name: Anastazja Isaac MRN: [id number] Date of Birth: Aug 14, 1956

## 2020-12-28 ENCOUNTER — Other Ambulatory Visit: Payer: Self-pay

## 2020-12-28 ENCOUNTER — Encounter: Payer: Self-pay | Admitting: Physical Therapy

## 2020-12-28 ENCOUNTER — Ambulatory Visit: Payer: Medicare HMO | Admitting: Physical Therapy

## 2020-12-28 DIAGNOSIS — M6281 Muscle weakness (generalized): Secondary | ICD-10-CM

## 2020-12-28 DIAGNOSIS — R269 Unspecified abnormalities of gait and mobility: Secondary | ICD-10-CM

## 2020-12-28 DIAGNOSIS — M25675 Stiffness of left foot, not elsewhere classified: Secondary | ICD-10-CM

## 2020-12-28 DIAGNOSIS — M79672 Pain in left foot: Secondary | ICD-10-CM

## 2020-12-28 NOTE — Therapy (Signed)
[facility] [address] [place], [place], [address] Phone: [phone]   Fax:  [phone]  Physical Therapy Treatment  Patient Details  Name: Deborah Estrada MRN: [id number] Date of Birth: 02-Oct-1956 Referring Provider (PT): Dr. Lanae Crumbly   Encounter Date: 12/28/2020   PT End of Session - 12/28/20 0856    Visit Number 13    Number of Visits 19    Date for PT Re-Evaluation 01/12/21    Authorization - Visit Number 3    Authorization - Number of Visits 10    PT Start Time 0856    PT Stop Time 0929    PT Time Calculation (min) 33 min    Activity Tolerance Patient limited by pain    Behavior During Therapy [facility] for tasks assessed/performed           Past Medical History:  Diagnosis Date  . HBP (high blood pressure)     Past Surgical History:  Procedure Laterality Date  . CHOLECYSTECTOMY    . COLONOSCOPY      There were no vitals filed for this visit.   Subjective Assessment - 12/28/20 0933    Subjective Pt. entered PT with continued reports of L plantar foot/ ankle pain.  Pt. states her foot really hurts with work-related tasks, climbing stairs, carrying vacuum and at night.  Pt. has difficulty sleeping secondary to L foot pain.    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  Pt. sleeps on back secondary to L knee/foot pain but prefers to sleep on R side.  Pt. wears New Balance shoes and has orthotics.    Patient Stated Goals Decrease pain in L foot    Currently in Pain? Yes    Pain Score 8     Pain Location Foot    Pain Onset More than a month ago             Manual tx.:   Gentle STM to L ankle/foot/gastroc in supine position/ manual isometrics to L ankle 5x all 4-planes in pain tolerable range.   Supine L ankle AA/PROM (all planes)- increase static holds. Pain limited.  Supine great toe PROM flexion/ extension 10x.   STM to L plantar  aspect of foot/ 2 nodules in supine (pain limited)      PT Long Term Goals - 12/17/20 1158      PT LONG TERM GOAL #1   Title Pt. will increase FOTO to 62 to improve functional mobility.    Baseline 12/1: initial FOTO 38.  12/28: 59 (marked improvement)- pain remains persistent.    Time 4    Period Weeks    Status Partially Met    Target Date 01/12/21      PT LONG TERM GOAL #2   Title Pt. will increase L ankle AROM as compared to R ankle to improve gait pattern.    Baseline Decrease L/R ankle AROM: DF: 0/4 deg., PF 44/58 deg., IV 22/30 deg., EV 0/ 8 deg.   12/28 (L ankle): DF 7 deg., PF: 55 deg., IV 26 deg., EV 16 deg. (R EV 23 deg.)    Time 4    Period Weeks    Status Partially Met    Target Date 01/12/21      PT LONG TERM GOAL #3   Title Pt. will report <5/10 L foot/ankle pain at rest to improve pain-free mobility.    Baseline Pt. reports  9-10/10 L foot pain.  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    Baseline high levels of L foot/ankle pain with house cleaning/ work-related tasks.    Time 4    Period Weeks    Status Not Met    Target Date 01/12/21                Plan - 12/28/20 0959    Clinical Impression Statement Pain continues to be pts. primary limitation with all walking/ functional tasks.  Pt. states she remains busy at work and has increase difficulty walking up a steep driveway and climbing stairs while carrying vacuum.  Marked pain with palpation over the 2 plantar nodules on L foot and during great toe stretches.  No change to HEP.    Personal Factors and Comorbidities Profession    Examination-Participation Restrictions Cleaning    Stability/Clinical Decision Making Evolving/Moderate complexity    Clinical Decision Making Moderate    Rehab Potential Fair    PT Frequency 2x  / week    PT Duration 4 weeks    PT Treatment/Interventions ADLs/Self Care Home Management;Cryotherapy;Moist Heat;Iontophoresis 14m/ml Dexamethasone;Electrical Stimulation;Contrast Bath;Gait training;Stair training;Functional mobility training;Neuromuscular re-education;Therapeutic exercise;Therapeutic activities;Patient/family education;Balance training;Orthotic Fit/Training;Manual techniques;Scar mobilization;Passive range of motion;Dry needling    PT Next Visit Plan Progress L ankle AROM and stability.    PT [place]          Patient will benefit from skilled therapeutic intervention in order to improve the following deficits and impairments:  Abnormal gait,Decreased balance,Decreased endurance,Decreased mobility,Difficulty walking,Hypomobility,Decreased range of motion,Decreased activity tolerance,Decreased strength,Impaired flexibility,Pain  Visit Diagnosis: Pain in left foot  Joint stiffness of left foot  Muscle weakness (generalized)  Gait difficulty     Problem List Patient Active Problem List   Diagnosis Date Noted  . Chronic hip pain (Bilateral) 11/14/2020  . Chronic knee pain (Left) 11/14/2020  . Enthesopathy of knee region (Left) 11/14/2020  . Chronic foot pain (Left) 11/14/2020  . Lumbar facet joint syndrome (Bilateral) 11/14/2020  . Chronic sacroiliac joint pain (Bilateral) 11/14/2020  . History foot surgery (Left) 11/14/2020    Class: History of  . Chronic pain syndrome 11/06/2020  . Pharmacologic therapy 11/06/2020  . Disorder of skeletal system 11/06/2020  . Problems influencing health status 11/06/2020  . Temporary low platelet count ([place] 12/03/2019  . Family history of breast cancer in mother 12/01/2019  . Generalized anxiety disorder 12/01/2019  . Chronic jaw pain 12/01/2019  . Acute conjunctivitis of left eye 05/30/2018  . Acute non-recurrent maxillary sinusitis 05/30/2018  . Class 2 obesity with body mass index (BMI) of 35.0 to  35.9 in adult 05/24/2018  . High risk medication use 05/24/2018  . Constipation 06/24/2017  . Cyst of ovary 06/24/2017  . Menopause present 06/24/2017  . Adnexal cyst 10/04/2016  . Dysuria 10/04/2016  . 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 Chronic pain of both shoulders 07/24/2016  . Family history of diabetes mellitus in first degree relative 07/24/2016  . Neck pain 07/24/2016  . Sciatica 07/24/2016  . Foot pain, right 03/25/2014  . Essential hypertension 03/25/2014  . Migraine headache 03/25/2014  . History of migraine 03/25/2014  .  Internal hemorrhoids 12/31/2011  . Screen for colon cancer 12/17/2011   Pura Spice, PT, DPT # [phone] 12/28/2020, 10:11 AM  [place] [facility] [address] [place], [place], [address] Phone: [phone]   Fax:  [phone]  Name: Preslea Rhodus MRN: [id number] Date of Birth: October 10, 1956

## 2021-01-03 ENCOUNTER — Other Ambulatory Visit: Payer: Self-pay

## 2021-01-03 ENCOUNTER — Ambulatory Visit: Payer: Medicare HMO | Attending: Podiatry | Admitting: Physical Therapy

## 2021-01-03 DIAGNOSIS — M25675 Stiffness of left foot, not elsewhere classified: Secondary | ICD-10-CM | POA: Insufficient documentation

## 2021-01-03 DIAGNOSIS — M6281 Muscle weakness (generalized): Secondary | ICD-10-CM | POA: Diagnosis present

## 2021-01-03 DIAGNOSIS — M79672 Pain in left foot: Secondary | ICD-10-CM | POA: Diagnosis not present

## 2021-01-03 DIAGNOSIS — R269 Unspecified abnormalities of gait and mobility: Secondary | ICD-10-CM | POA: Diagnosis present

## 2021-01-10 ENCOUNTER — Other Ambulatory Visit: Payer: Self-pay

## 2021-01-10 ENCOUNTER — Ambulatory Visit: Payer: Medicare HMO | Admitting: Physical Therapy

## 2021-01-10 DIAGNOSIS — M79672 Pain in left foot: Secondary | ICD-10-CM

## 2021-01-10 DIAGNOSIS — R269 Unspecified abnormalities of gait and mobility: Secondary | ICD-10-CM

## 2021-01-10 DIAGNOSIS — M6281 Muscle weakness (generalized): Secondary | ICD-10-CM

## 2021-01-10 DIAGNOSIS — M25675 Stiffness of left foot, not elsewhere classified: Secondary | ICD-10-CM

## 2021-01-11 ENCOUNTER — Encounter: Payer: Self-pay | Admitting: Physical Therapy

## 2021-01-13 ENCOUNTER — Encounter: Payer: Self-pay | Admitting: Physical Therapy

## 2021-01-13 NOTE — Therapy (Signed)
[place] [facility] [address] [place], [place], [address] Phone: [phone]   Fax:  [phone]  Physical Therapy Treatment  Patient Details  Name: Deborah Estrada MRN: [id number] Date of Birth: Mar 26, 1956 Referring Provider (PT): Dr. Lanae Crumbly   Encounter Date: 01/03/2021   Treatment: 83 of 51.  Recert date: 07/20/568 1648 to 1725   Past Medical History:  Diagnosis Date  . HBP (high blood pressure)     Past Surgical History:  Procedure Laterality Date  . CHOLECYSTECTOMY    . COLONOSCOPY      There were no vitals filed for this visit.      Pt. states L plantar foot/ ankle/ toe pain remains persistent. Pt. states L foot pain is really bad at night time and first thing in the morning.       Manual tx.:   Gentle STM to L ankle/foot/gastroc in supine position/ manual isometrics to L ankle 5x all 4-planes in pain tolerable range.  Supine L ankle AA/PROM (all planes)- increase static holds. Pain limited.  Supine great toe PROMflexion/ extension 10x.  STM to L plantar aspect of foot/ 2 nodules in supine (pain limited)     PT Long Term Goals - 12/17/20 1158      PT LONG TERM GOAL #1   Title Pt. will increase FOTO to 62 to improve functional mobility.    Baseline 12/1: initial FOTO 38.  12/28: 59 (marked improvement)- pain remains persistent.    Time 4    Period Weeks    Status Partially Met    Target Date 01/12/21      PT LONG TERM GOAL #2   Title Pt. will increase L ankle AROM as compared to R ankle to improve gait pattern.    Baseline Decrease L/R ankle AROM: DF: 0/4 deg., PF 44/58 deg., IV 22/30 deg., EV 0/ 8 deg.   12/28 (L ankle): DF 7 deg., PF: 55 deg., IV 26 deg., EV 16 deg. (R EV 23 deg.)    Time 4    Period Weeks    Status Partially Met    Target Date 01/12/21      PT LONG TERM GOAL #3   Title Pt. will report <5/10 L foot/ankle pain at rest to improve pain-free mobility.     Baseline Pt. reports 9-10/10 L foot pain.  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    Baseline high levels of L foot/ankle pain with house cleaning/ work-related tasks.    Time 4    Period Weeks    Status Not Met    Target Date 01/12/21            Pain with all manual tx. to L plantar aspect of foot/ toe and ankle stretches. PT discussed use of ball/ massage to L plantar aspect of foot and importance of walking with increase toe extension during toe off phase of gait. Less redness/ color blotching noted in L foot today during tx. session. Pt. continues to ambulate with limited foot/ankle movement and marked increase in pain reported.       Patient will benefit from skilled therapeutic intervention in order to improve the following deficits and  impairments:  Abnormal gait,Decreased balance,Decreased endurance,Decreased mobility,Difficulty walking,Hypomobility,Decreased range of motion,Decreased activity tolerance,Decreased strength,Impaired flexibility,Pain  Visit Diagnosis: Pain in left foot  Joint stiffness of left foot  Muscle weakness (generalized)  Gait difficulty     Problem List Patient Active Problem List   Diagnosis Date Noted  . Chronic hip pain (Bilateral) 11/14/2020  . Chronic knee pain (Left) 11/14/2020  . Enthesopathy of knee region (Left) 11/14/2020  . Chronic foot pain (Left) 11/14/2020  . Lumbar facet joint syndrome (Bilateral) 11/14/2020  . Chronic sacroiliac joint pain (Bilateral) 11/14/2020  . History foot surgery (Left) 11/14/2020    Class: History of  . Chronic pain syndrome 11/06/2020  . Pharmacologic therapy 11/06/2020  . Disorder of skeletal system 11/06/2020  . Problems influencing health status 11/06/2020  . Temporary low platelet count ([place]) 12/03/2019  .  Family history of breast cancer in mother 12/01/2019  . Generalized anxiety disorder 12/01/2019  . Chronic jaw pain 12/01/2019  . Acute conjunctivitis of left eye 05/30/2018  . Acute non-recurrent maxillary sinusitis 05/30/2018  . Class 2 obesity with body mass index (BMI) of 35.0 to 35.9 in adult 05/24/2018  . High risk medication use 05/24/2018  . Constipation 06/24/2017  . Cyst of ovary 06/24/2017  . Menopause present 06/24/2017  . Adnexal cyst 10/04/2016  . Dysuria 10/04/2016  . 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 Chronic pain of both shoulders 07/24/2016  . Family history of diabetes mellitus in first degree relative 07/24/2016  . Neck pain 07/24/2016  . Sciatica 07/24/2016  . Foot pain, right 03/25/2014  . Essential hypertension 03/25/2014  . Migraine headache 03/25/2014  . History of migraine 03/25/2014  . Internal hemorrhoids 12/31/2011  . Screen for colon cancer 12/17/2011   Pura Spice, PT, DPT # [phone] 01/13/2021, 6:25 AM  [place] [facility] [address] [place], [place], [address] Phone: [phone]   Fax:  [phone]  Name: Deborah Estrada MRN: [id number] Date of Birth: 05-01-1956

## 2021-01-13 NOTE — Therapy (Signed)
[place] [facility] [address] [place], [place], [address] Phone: [phone]   Fax:  [phone]  Physical Therapy Treatment  Patient Details  Name: Deborah Estrada MRN: [id number] Date of Birth: [birth year] Referring Provider (PT): Dr. Lanae Crumbly   Encounter Date: 01/10/2021   PT End of Session - 01/13/21 0912    Visit Number 15    Number of Visits 19    Date for PT Re-Evaluation 01/12/21    Authorization - Visit Number 5    Authorization - Number of Visits 10    PT Start Time 1660    PT Stop Time 1721    PT Time Calculation (min) 37 min    Activity Tolerance Patient limited by pain    Behavior During Therapy [facility] for tasks assessed/performed           Past Medical History:  Diagnosis Date  . HBP (high blood pressure)     Past Surgical History:  Procedure Laterality Date  . CHOLECYSTECTOMY    . COLONOSCOPY      There were no vitals filed for this visit.   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  Pt. states the pain in her L foot has not improved.    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  Pt. sleeps on back secondary to L knee/foot pain but prefers to sleep on R side.  Pt. wears New Balance shoes and has orthotics.    Patient Stated Goals Decrease pain in L foot    Currently in Pain? Yes    Pain Score 8     Pain Location Foot    Pain Orientation Left    Pain Onset More than a month ago               Manual tx.:   Gentle STM to L ankle/foot/gastrocin supine position/ reassessment of L plantar nodules in supine position (TTP)  Supine L ankle AA/PROM (all planes)- increase static holds. Pain limited.  Supine great toe AA/PROMflexion/ extension 10x.  STM to L plantar aspect of foot.  Supine L ankle DF/ toe AROM  No [place] today    PT Long Term Goals -  12/17/20 1158      PT LONG TERM GOAL #1   Title Pt. will increase FOTO to 62 to improve functional mobility.    Baseline 12/1: initial FOTO 38.  12/28: 59 (marked improvement)- pain remains persistent.    Time 4    Period Weeks    Status Partially Met    Target Date 01/12/21      PT LONG TERM GOAL #2   Title Pt. will increase L ankle AROM as compared to R ankle to improve gait pattern.    Baseline Decrease L/R ankle AROM: DF: 0/4 deg., PF 44/58 deg., IV 22/30 deg., EV 0/ 8 deg.   12/28 (L ankle): DF 7 deg., PF: 55 deg., IV 26 deg., EV 16 deg. (R EV 23 deg.)    Time 4    Period Weeks    Status Partially Met    Target Date 01/12/21      PT LONG TERM GOAL #3   Title Pt. will report <5/10 L foot/ankle pain at rest to improve pain-free mobility.    Baseline Pt. reports 9-10/10 L foot pain.  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    Baseline high levels of L foot/ankle pain with house cleaning/ work-related tasks.    Time 4    Period Weeks    Status Not Met    Target Date 01/12/21                 Plan - 01/13/21 0914    Clinical Impression Statement Pt. reports significant c/o L plantar foot/ toe pain during manual tx. and guarded with toe stretches, esp. L great toe.  No swelling or redness noted in L foot today after long day of work.  Pt. encouraged to use massage ball and continue with daily ankle/foot stretches.  Pt. continues to present with 2 nodules on plantar aspect of L foot (significant TTP).    Personal Factors and Comorbidities Profession    Examination-Participation Restrictions Cleaning    Stability/Clinical Decision Making Evolving/Moderate complexity    Clinical Decision Making Moderate    Rehab Potential Fair    PT Frequency 2x / week    PT Duration 4 weeks    PT  Treatment/Interventions ADLs/Self Care Home Management;Cryotherapy;Moist Heat;Iontophoresis 41m/ml Dexamethasone;Electrical Stimulation;Contrast Bath;Gait training;Stair training;Functional mobility training;Neuromuscular re-education;Therapeutic exercise;Therapeutic activities;Patient/family education;Balance training;Orthotic Fit/Training;Manual techniques;Scar mobilization;Passive range of motion;Dry needling    PT Next Visit Plan Progress L ankle AROM and stability.  RECERT/ check goals.    PT [place]          Patient will benefit from skilled therapeutic intervention in order to improve the following deficits and impairments:  Abnormal gait,Decreased balance,Decreased endurance,Decreased mobility,Difficulty walking,Hypomobility,Decreased range of motion,Decreased activity tolerance,Decreased strength,Impaired flexibility,Pain  Visit Diagnosis: Pain in left foot  Joint stiffness of left foot  Muscle weakness (generalized)  Gait difficulty     Problem List Patient Active Problem List   Diagnosis Date Noted  . Chronic hip pain (Bilateral) 11/14/2020  . Chronic knee pain (Left) 11/14/2020  . Enthesopathy of knee region (Left) 11/14/2020  . Chronic foot pain (Left) 11/14/2020  . Lumbar facet joint syndrome (Bilateral) 11/14/2020  . Chronic sacroiliac joint pain (Bilateral) 11/14/2020  . History foot surgery (Left) 11/14/2020    Class: History of  . Chronic pain syndrome 11/06/2020  . Pharmacologic therapy 11/06/2020  . Disorder of skeletal system 11/06/2020  . Problems influencing health status 11/06/2020  . Temporary low platelet count ([place] 12/03/2019  . Family history of breast cancer in mother 12/01/2019  . Generalized anxiety disorder 12/01/2019  . Chronic jaw pain 12/01/2019  . Acute conjunctivitis of left eye 05/30/2018  . Acute non-recurrent maxillary sinusitis 05/30/2018  . Class 2 obesity with body mass index (BMI) of 35.0 to 35.9 in adult  05/24/2018  . High risk medication use 05/24/2018  . Constipation 06/24/2017  . Cyst of ovary 06/24/2017  . Menopause present 06/24/2017  . Adnexal cyst 10/04/2016  . Dysuria 10/04/2016  . 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 Chronic pain of both shoulders 07/24/2016  . Family history of diabetes mellitus in first degree relative 07/24/2016  . Neck pain 07/24/2016  . Sciatica 07/24/2016  . Foot pain, right 03/25/2014  . Essential hypertension 03/25/2014  . Migraine headache 03/25/2014  . History of migraine  03/25/2014  . Internal hemorrhoids 12/31/2011  . Screen for colon cancer 12/17/2011   Pura Spice, PT, DPT # [phone] 01/13/2021, 9:19 AM  [place] [facility] [address] [place], [place], [address] Phone: [phone]   Fax:  [phone]  Name: Hiawatha Dressel MRN: [id number] Date of Birth: 08/20/1956

## 2021-01-17 ENCOUNTER — Ambulatory Visit: Payer: Medicare HMO | Admitting: Physical Therapy

## 2021-01-17 ENCOUNTER — Other Ambulatory Visit: Payer: Self-pay

## 2021-01-17 DIAGNOSIS — R269 Unspecified abnormalities of gait and mobility: Secondary | ICD-10-CM

## 2021-01-17 DIAGNOSIS — M79672 Pain in left foot: Secondary | ICD-10-CM

## 2021-01-17 DIAGNOSIS — M25675 Stiffness of left foot, not elsewhere classified: Secondary | ICD-10-CM

## 2021-01-17 DIAGNOSIS — M6281 Muscle weakness (generalized): Secondary | ICD-10-CM

## 2021-01-22 ENCOUNTER — Encounter: Payer: Self-pay | Admitting: Physical Therapy

## 2021-01-22 NOTE — Therapy (Signed)
[facility] [address] [place], [place], [address] Phone: [phone]   Fax:  [phone]  Physical Therapy Treatment  Patient Details  Name: Deborah Estrada MRN: [id number] Date of Birth: [birth year] Referring Provider (PT): Dr. Lanae Crumbly   Encounter Date: 01/17/2021   PT End of Session - 01/22/21 1842    Visit Number 16    Number of Visits 20    Date for PT Re-Evaluation 02/14/21    Authorization - Visit Number 6    Authorization - Number of Visits 10    PT Start Time 7262    PT Stop Time 1640    PT Time Calculation (min) 44 min    Activity Tolerance Patient limited by pain    Behavior During Therapy [facility] for tasks assessed/performed           Past Medical History:  Diagnosis Date  . HBP (high blood pressure)     Past Surgical History:  Procedure Laterality Date  . CHOLECYSTECTOMY    . COLONOSCOPY      There were no vitals filed for this visit.   Subjective Assessment - 01/22/21 1839    Subjective No improvement in L foot pain reported and pt. has 2nd opinion f/u in 2 weeks.  Pt. states her L foot pain worsens in evening and makes sleeping difficult.    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  Pt. sleeps on back secondary to L knee/foot pain but prefers to sleep on R side.  Pt. wears New Balance shoes and has orthotics.    Patient Stated Goals Decrease pain in L foot    Currently in Pain? Yes    Pain Score 9     Pain Location Foot    Pain Orientation Left    Pain Onset More than a month ago              [facility] PT Assessment - 01/22/21 0001      Assessment   Medical Diagnosis L foot pain    Referring Provider (PT) Dr. Lanae Crumbly    Onset Date/Surgical Date 07/01/20      [place] residence      Prior Function   Level of Independence Independent            Manual tx.:   Supine L ankle  AA/PROM (all planes)- increase static holds. Pain limited.  Gentle STM to L ankle/foot/gastrocin supine position/ reassessment of ankle and toe AROM.    Supine great toe AA/PROMflexion/ extension 10x.  STM to L plantar aspect of foot.    Standing L toe extension on blue mat next to mat table.       PT Long Term Goals - 01/22/21 1849      PT LONG TERM GOAL #1   Title Pt. will increase FOTO to 62 to improve functional mobility.    Baseline 12/1: initial FOTO 38.  12/28: 59 (marked improvement)- pain remains persistent.    Time 4    Period Weeks    Status Partially Met    Target Date 02/14/21      PT LONG TERM GOAL #2   Title Pt. will increase L ankle AROM as compared to R ankle to improve gait pattern.    Baseline Decrease L/R ankle AROM: DF: 0/4 deg., PF 44/58 deg., IV 22/30 deg., EV 0/ 8 deg.   12/28 (L  ankle): DF 7 deg., PF: 55 deg., IV 26 deg., EV 16 deg. (R EV 23 deg.).  3/15: DF: 8 deg./ PF: 55 deg./ IV: 28 deg./ EV 20 deg. (all pain limited).    Time 4    Period Weeks    Status Partially Met    Target Date 02/14/21      PT LONG TERM GOAL #3   Title Pt. will report <5/10 L foot/ankle pain at rest to improve pain-free mobility.    Baseline Pt. reports 9-10/10 L foot pain.  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    Baseline high levels of L foot/ankle pain with house cleaning/ work-related tasks.    Time 4    Period Weeks    Status Not Met    Target Date 02/14/21                 Plan - 01/22/21 1844    Clinical Impression Statement Pain remains pts. primary complaint with all work-related tasks and especially at night time.  Pt. c/o >8/10 pain with all manual tx. to L plantar aspect of foot/ toe and ankle stretches. PT continues to discuss importance of walking with increase  toe extension during toe off phase of gait.  Less redness/ color blotching noted in L foot today during tx. session. Pt. continues to ambulate with limited foot/ankle movement and marked increase in pain reported.  Pt. scheduled for 2nd opinion f/u with Dr. Donnie Mesa to determine POC/ pain mgmt.  Pt. has not want to return to pain mgmt. with Dr. Dossie Arbour.  PT will be on hold until after MD f/u to determine benefit of conservative tx.    Personal Factors and Comorbidities Profession    Examination-Participation Restrictions Cleaning    Stability/Clinical Decision Making Evolving/Moderate complexity    Clinical Decision Making Moderate    Rehab Potential Fair    PT Frequency 1x / week    PT Duration 4 weeks    PT Treatment/Interventions ADLs/Self Care Home Management;Cryotherapy;Moist Heat;Iontophoresis 22m/ml Dexamethasone;Electrical Stimulation;Contrast Bath;Gait training;Stair training;Functional mobility training;Neuromuscular re-education;Therapeutic exercise;Therapeutic activities;Patient/family education;Balance training;Orthotic Fit/Training;Manual techniques;Scar mobilization;Passive range of motion;Dry needling    PT Next Visit Plan Progress L ankle AROM and stability.  Discuss MD f/u    PT [place]          Patient will benefit from skilled therapeutic intervention in order to improve the following deficits and impairments:  Abnormal gait,Decreased balance,Decreased endurance,Decreased mobility,Difficulty walking,Hypomobility,Decreased range of motion,Decreased activity tolerance,Decreased strength,Impaired flexibility,Pain  Visit Diagnosis: Pain in left foot  Joint stiffness of left foot  Muscle weakness (generalized)  Gait difficulty     Problem List Patient Active Problem List   Diagnosis Date Noted  . Chronic hip pain (Bilateral) 11/14/2020  . Chronic knee pain (Left) 11/14/2020  . Enthesopathy of knee region (Left) 11/14/2020  . Chronic foot pain  (Left) 11/14/2020  . Lumbar facet joint syndrome (Bilateral) 11/14/2020  . Chronic sacroiliac joint pain (Bilateral) 11/14/2020  . History foot surgery (Left) 11/14/2020    Class: History of  . Chronic pain syndrome 11/06/2020  . Pharmacologic therapy 11/06/2020  . Disorder of skeletal system 11/06/2020  . Problems influencing health status 11/06/2020  .  Temporary low platelet count ([place]) 12/03/2019  . Family history of breast cancer in mother 12/01/2019  . Generalized anxiety disorder 12/01/2019  . Chronic jaw pain 12/01/2019  . Acute conjunctivitis of left eye 05/30/2018  . Acute non-recurrent maxillary sinusitis 05/30/2018  . Class 2 obesity with body mass index (BMI) of 35.0 to 35.9 in adult 05/24/2018  . High risk medication use 05/24/2018  . Constipation 06/24/2017  . Cyst of ovary 06/24/2017  . Menopause present 06/24/2017  . Adnexal cyst 10/04/2016  . Dysuria 10/04/2016  . 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 Chronic pain of both shoulders 07/24/2016  . Family history of diabetes mellitus in first degree relative 07/24/2016  . Neck pain 07/24/2016  . Sciatica 07/24/2016  . Foot pain, right 03/25/2014  . Essential hypertension 03/25/2014  . Migraine headache 03/25/2014  . History of migraine 03/25/2014  . Internal hemorrhoids 12/31/2011  . Screen for colon cancer 12/17/2011   Pura Spice, PT, DPT # [phone] 01/22/2021, 6:52 PM  [place] [facility] [address] [place], [place], [address] Phone: [phone]   Fax:  [phone]  Name: Deborah Estrada MRN: [id number] Date of Birth: June 29, 1956

## 2021-02-20 ENCOUNTER — Other Ambulatory Visit: Payer: Self-pay | Admitting: Orthopaedic Surgery

## 2021-02-20 ENCOUNTER — Other Ambulatory Visit: Payer: Medicare HMO | Admitting: Orthopaedic Surgery

## 2021-02-20 DIAGNOSIS — M722 Plantar fascial fibromatosis: Secondary | ICD-10-CM

## 2021-02-20 DIAGNOSIS — M79672 Pain in left foot: Secondary | ICD-10-CM

## 2021-03-02 ENCOUNTER — Ambulatory Visit
Admission: RE | Admit: 2021-03-02 | Discharge: 2021-03-02 | Disposition: A | Discharge status: Home / Self Care | Payer: Medicare HMO | Source: Ambulatory Visit | Attending: Orthopaedic Surgery | Admitting: Orthopaedic Surgery

## 2021-03-02 DIAGNOSIS — M79672 Pain in left foot: Secondary | ICD-10-CM

## 2021-03-02 DIAGNOSIS — M722 Plantar fascial fibromatosis: Secondary | ICD-10-CM

## 2021-03-02 MED ORDER — GADOBENATE DIMEGLUMINE 529 MG/ML IV SOLN
14.0000 mL | Freq: Once | INTRAVENOUS | Status: AC | PRN
  Administered 2021-03-02: 14 mL via INTRAVENOUS

## 2021-03-04 ENCOUNTER — Other Ambulatory Visit: Payer: Medicare HMO

## 2021-03-31 ENCOUNTER — Encounter: Payer: Self-pay | Admitting: Emergency Medicine

## 2021-03-31 ENCOUNTER — Other Ambulatory Visit: Payer: Self-pay

## 2021-03-31 ENCOUNTER — Ambulatory Visit
Admission: EM | Admit: 2021-03-31 | Discharge: 2021-03-31 | Disposition: A | Discharge status: Home / Self Care | Payer: Medicare HMO | Attending: Sports Medicine | Admitting: Sports Medicine

## 2021-03-31 DIAGNOSIS — R040 Epistaxis: Secondary | ICD-10-CM

## 2021-03-31 DIAGNOSIS — R0982 Postnasal drip: Secondary | ICD-10-CM

## 2021-03-31 DIAGNOSIS — J320 Chronic maxillary sinusitis: Secondary | ICD-10-CM

## 2021-03-31 DIAGNOSIS — R0981 Nasal congestion: Secondary | ICD-10-CM

## 2021-03-31 DIAGNOSIS — R519 Headache, unspecified: Secondary | ICD-10-CM

## 2021-03-31 MED ORDER — FLUCONAZOLE 200 MG PO TABS: 200.0000 mg | ORAL_TABLET | Freq: Every day | ORAL | 0 refills | Status: AC

## 2021-03-31 MED ORDER — AMOXICILLIN-POT CLAVULANATE 875-125 MG PO TABS: 1.0000 | ORAL_TABLET | Freq: Two times a day (BID) | ORAL | 0 refills | Status: AC

## 2021-03-31 NOTE — ED Provider Notes (Signed)
[facility]    CSN: [id number] Arrival date & time: 03/31/21  1634      History   Chief Complaint Chief Complaint  Patient presents with  . Epistaxis    HPI Deborah Estrada is a 65 y.o. female.   65 year old female presents for evaluation of the above issue.  Patient normally sees [facility] in [place] but they could not see her today.  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  She reports several days of right-sided sinus pressure.  She notes that she has some blood when she blows her nose that is on the tissue.  She is not having a significant nosebleed.  She reports nasal congestion, rhinorrhea, postnasal drip.  She has no history of seasonal allergies but she does take Allegra and Flonase on occasion.  She says when she takes the Flonase it makes her nasal passages burn.  She denies any fever shakes chills.  No nausea vomiting or diarrhea.  No significant head ache.  Just the right-sided facial pressure.  No sore throat.  No painful vision double vision or blurry vision.  No chest pain shortness of breath or wheeze.  No red flag signs or symptoms elicited on history.     Past Medical History:  Diagnosis Date  . HBP (high blood pressure)     Patient Active Problem List   Diagnosis Date Noted  . Chronic hip pain (Bilateral) 11/14/2020  . Chronic knee pain (Left) 11/14/2020  . Enthesopathy of knee region (Left) 11/14/2020  . Chronic foot pain (Left) 11/14/2020  . Lumbar facet joint syndrome (Bilateral) 11/14/2020  . Chronic sacroiliac joint pain (Bilateral) 11/14/2020  . History foot surgery (Left) 11/14/2020    Class: History of  . Chronic pain syndrome 11/06/2020  . Pharmacologic therapy 11/06/2020  . Disorder of skeletal system 11/06/2020  . Problems influencing health status 11/06/2020  . Temporary low platelet count (HCC) 12/03/2019  . Family history of breast cancer in mother 12/01/2019  . Generalized anxiety  disorder 12/01/2019  . Chronic jaw pain 12/01/2019  . Acute conjunctivitis of left eye 05/30/2018  . Acute non-recurrent maxillary sinusitis 05/30/2018  . Class 2 obesity with body mass index (BMI) of 35.0 to 35.9 in adult 05/24/2018  . High risk medication use 05/24/2018  . Constipation 06/24/2017  . Cyst of ovary 06/24/2017  . Menopause present 06/24/2017  . Adnexal cyst 10/04/2016  . Dysuria 10/04/2016  . 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 Chronic pain of both shoulders 07/24/2016  . Family history of diabetes mellitus in first degree relative 07/24/2016  . Neck pain 07/24/2016  . Sciatica 07/24/2016  . Foot pain, right 03/25/2014  . Essential hypertension 03/25/2014  . Migraine headache 03/25/2014  . History of migraine 03/25/2014  . Internal hemorrhoids 12/31/2011  . Screen for colon cancer 12/17/2011    Past Surgical History:  Procedure Laterality Date  . CHOLECYSTECTOMY    . COLONOSCOPY    . FOOT SURGERY Left     OB History   No obstetric history on file.      Home Medications    Prior to Admission medications   Medication Sig Start Date End Date Taking? Authorizing Provider  amoxicillin-clavulanate (AUGMENTIN) 875-125 MG tablet Take 1 tablet by mouth every 12 (twelve) hours. 03/31/21  Yes Delton See, MD  Ascorbic Acid (VITAMIN C WITH ROSE HIPS) 500 MG tablet Take 500 mg by mouth daily.   Yes [provider]  aspirin 81 MG chewable tablet Chew by mouth daily.   Yes [provider]  Cholecalciferol (VITAMIN D) 2000 units tablet Take by mouth.   Yes [provider]  fexofenadine (ALLEGRA) 180 MG tablet Take 180 mg by mouth daily.   Yes [provider]  fluconazole (DIFLUCAN) 200 MG tablet Take 1 tablet (200 mg total) by mouth daily for 2 days. Take 1 tablet when initiating antibiotics.  If you feel like you have any symptoms you can repeat it in 1 week.  Please use probiotics or yogurt. 03/31/21  04/02/21 Yes Delton SeeBarnes, Shiara Mcgough, MD  fluticasone (FLONASE) 50 MCG/ACT nasal spray SHAKE LQ AND U 2 SPRAYS IEN D 04/04/17  Yes [provider]  gabapentin (NEURONTIN) 300 MG capsule Take 1 capsule (300 mg total) by mouth 3 (three) times daily. 09/21/20  Yes McDonald, Rachelle HoraAdam R, DPM  Multiple Vitamin (MULTIVITAMIN WITH MINERALS) TABS tablet Take 1 tablet by mouth daily.   Yes [provider]  triamterene-hydrochlorothiazide (MAXZIDE-25) 37.5-25 MG tablet TK 1 T PO QD 03/24/18  Yes [provider]  vitamin B-12 (CYANOCOBALAMIN) 1000 MCG tablet Take 1,000 mcg by mouth daily.   Yes [provider]  lidocaine-prilocaine (EMLA) cream APPLY TOPICALLY AS NEEDED 12/01/20   Edwin CapMcDonald, Adam R, DPM  NONFORMULARY OR COMPOUNDED ITEM [id number] Pain Cream 30 gm pump Apply 1-2 pumps 3-4 x daily 3 refills Sent in 05-07-19    [provider]  NONFORMULARY OR COMPOUNDED ITEM Apply 2-3 pumps 3-4 times daily as needed 05/11/19   Hyatt, Max T, DPM  ondansetron (ZOFRAN) 4 MG tablet Take 1 tablet (4 mg total) by mouth every 8 (eight) hours as needed. 06/30/20   Hyatt, Max T, DPM  ondansetron (ZOFRAN-ODT) 4 MG disintegrating tablet Take 4 mg by mouth every 8 (eight) hours as needed. 06/10/20   [provider]  promethazine (PHENERGAN) 25 MG tablet Take 1 tablet (25 mg total) by mouth every 8 (eight) hours as needed. 07/06/20   Hyatt, Max T, DPM    Family History Family History  Problem Relation Age of Onset  . Breast cancer Mother   . Heart attack Father     Social History Social History   Tobacco Use  . Smoking status: Never Smoker  . Smokeless tobacco: Never Used  Vaping Use  . Vaping Use: Never used  Substance Use Topics  . Alcohol use: Yes    Comment: social  . Drug use: No     Allergies   No known allergies   Review of Systems Review of Systems  Constitutional: Negative for activity change, appetite change, chills, diaphoresis, fatigue and  fever.  HENT: Positive for congestion, postnasal drip, rhinorrhea and sinus pressure. Negative for ear pain, facial swelling, sinus pain, sneezing and sore throat.   Eyes: Negative for photophobia, pain and visual disturbance.  Respiratory: Negative for cough, chest tightness, shortness of breath and wheezing.   Cardiovascular: Negative for chest pain and palpitations.  Gastrointestinal: Negative for abdominal pain, diarrhea, nausea and vomiting.  Genitourinary: Negative for dysuria.  Musculoskeletal: Negative for back pain, myalgias, neck pain and neck stiffness.  Skin: Negative for color change, pallor, rash and wound.  Neurological: Negative for dizziness, speech difficulty, light-headedness, numbness and headaches.  All other systems reviewed and are negative.    Physical Exam Triage Vital Signs ED Triage Vitals  Enc Vitals Group     BP 03/31/21 1706 (!) 146/73     Pulse Rate 03/31/21 1706 77  Resp 03/31/21 1706 14     Temp 03/31/21 1706 98.5 F (36.9 C)     Temp Source 03/31/21 1706 Oral     SpO2 03/31/21 1706 99 %     Weight 03/31/21 1702 145 lb (65.8 kg)     Height 03/31/21 1702 5\' 4"  (1.626 m)     Head Circumference --      Peak Flow --      Pain Score 03/31/21 1702 8     Pain Loc --      Pain Edu? --      Excl. in GC? --    No data found.  Updated Vital Signs BP (!) 146/73 (BP Location: Left Arm)   Pulse 77   Temp 98.5 F (36.9 C) (Oral)   Resp 14   Ht 5\' 4"  (1.626 m)   Wt 65.8 kg   SpO2 99%   BMI 24.89 kg/m   Visual Acuity Right Eye Distance:   Left Eye Distance:   Bilateral Distance:    Right Eye Near:   Left Eye Near:    Bilateral Near:     Physical Exam Vitals and nursing note reviewed.  Constitutional:      General: She is not in acute distress.    Appearance: Normal appearance. She is not ill-appearing, toxic-appearing or diaphoretic.  HENT:     Head: Normocephalic and atraumatic.     Right Ear: Tympanic membrane normal.     Left Ear:  Tympanic membrane normal.     Nose: Congestion and rhinorrhea present.     Right Turbinates: Enlarged and swollen.     Left Turbinates: Enlarged and swollen.     Right Sinus: Maxillary sinus tenderness present.     Left Sinus: No maxillary sinus tenderness.     Comments: There is some dried blood seen bilateral nares right greater than left.  No active bleeding.  Turbinates are quite swollen.    Mouth/Throat:     Mouth: Mucous membranes are moist.     Pharynx: No oropharyngeal exudate or posterior oropharyngeal erythema.  Eyes:     General: No scleral icterus.       Right eye: No discharge.        Left eye: No discharge.     Extraocular Movements: Extraocular movements intact.     Conjunctiva/sclera: Conjunctivae normal.     Pupils: Pupils are equal, round, and reactive to light.  Cardiovascular:     Rate and Rhythm: Normal rate and regular rhythm.     Pulses: Normal pulses.     Heart sounds: Normal heart sounds. No murmur heard. No friction rub. No gallop.   Pulmonary:     Effort: Pulmonary effort is normal.     Breath sounds: Normal breath sounds. No stridor. No wheezing, rhonchi or rales.  Musculoskeletal:     Cervical back: Normal range of motion and neck supple. No rigidity.  Skin:    General: Skin is warm and dry.     Capillary Refill: Capillary refill takes less than 2 seconds.     Findings: No bruising, erythema, lesion or rash.  Neurological:     General: No focal deficit present.     Mental Status: She is alert and oriented to person, place, and time.  Psychiatric:        Mood and Affect: Mood normal.      UC Treatments / Results  Labs (all labs ordered are listed, but only abnormal results are displayed) Labs Reviewed - No  data to display  EKG   Radiology No results found.  Procedures Procedures (including critical care time)  Medications Ordered in UC Medications - No data to display  Initial Impression / Assessment and Plan / UC Course  I have  reviewed the triage vital signs and the nursing notes.  Pertinent labs & imaging results that were available during my care of the patient were reviewed by me and considered in my medical decision making (see chart for details).  Clinical impression: Right-sided facial pain.  Clinically patient has maxillary sinusitis.  There is some blood when she blows her nose but no active bleeding.  No actual active nosebleed.  Treatment plan: 1.  The findings and treatment plan were discussed in detail with the patient.  Patient was in agreement. 2.  I did indicate that her bleeding when she blows her nose is coming from her sinuses.  I am going to go ahead and treat her.  Sent in Augmentin.  Also renewed her Flonase and asked her to use that if she can tolerate it with the burning. 3.  Educational handouts provided. 4.  Want her to do sinus flushes.  She can consider purchasing a Nettie pot. 5.  Tylenol for any discomfort. 6.  Continue with her seasonal allergies medications. 7.  I asked her to follow-up with her primary care physician if symptoms persist. 8.  If they worsen she should go to the ER. 9.  She was discharged from care in stable condition and will follow-up here as needed.    Final Clinical Impressions(s) / UC Diagnoses   Final diagnoses:  Right maxillary sinusitis  Right-sided epistaxis  Nasal congestion  Post-nasal drip  Right sided facial pain     Discharge Instructions     As we discussed, your nosebleed is coming from your sinuses.  On examination he has some dried blood but no active bleeding.  If that changes you need to go to the ER.  You would need a higher level of care that ENT would need to evaluate you. I went ahead and sent in a prescription for a sinus infection. Please see educational handouts. You can use Tylenol for any discomfort. You can continue with your allergy medications as needed. Please contact your primary care provider if symptoms persist. If they  worsen please go to the ER.    ED Prescriptions    Medication Sig Dispense Auth. Provider   amoxicillin-clavulanate (AUGMENTIN) 875-125 MG tablet Take 1 tablet by mouth every 12 (twelve) hours. 14 tablet Delton See, MD   fluconazole (DIFLUCAN) 200 MG tablet Take 1 tablet (200 mg total) by mouth daily for 2 days. Take 1 tablet when initiating antibiotics.  If you feel like you have any symptoms you can repeat it in 1 week.  Please use probiotics or yogurt. 2 tablet Delton See, MD     PDMP not reviewed this encounter.   Delton See, MD 04/01/21 [phone]

## 2021-03-31 NOTE — Discharge Instructions (Signed)
As we discussed, your nosebleed is coming from your sinuses.  On examination he has some dried blood but no active bleeding.  If that changes you need to go to the ER.  You would need a higher level of care that ENT would need to evaluate you. I went ahead and sent in a prescription for a sinus infection. Please see educational handouts. You can use Tylenol for any discomfort. You can continue with your allergy medications as needed. Please contact your primary care provider if symptoms persist. If they worsen please go to the ER.

## 2021-03-31 NOTE — ED Triage Notes (Signed)
Patient reports bloody nasal drainage and sinus pain on the right side of her face that started 2 days ago.  Patient denies fevers.

## 2021-05-23 ENCOUNTER — Telehealth: Payer: Self-pay | Admitting: *Deleted

## 2021-05-23 NOTE — Telephone Encounter (Signed)
"  I would like to get a complete medical records of my surgery and my x-rays and everything that has been done.  I'd like to pick it up tomorrow if I could.  Call me back."  Deborah Estrada called again and I informed her that there is a 10 - 14 business day turn around time to get the information ready.  Yes asked me to call her when it is ready.

## 2021-06-05 NOTE — Telephone Encounter (Signed)
"  I need to be able to pick up my medical records, of my surgery, my xrays, everything.  I need it today if I can get it.  This is the second time I have called today and I called last week about it.  Somebody needs to call me back today."

## 2021-06-06 IMAGING — MR MR FOOT*L* WO/W CM
5 of 9 series · 19 of 40 positions shown · IV contrast (multihance)
Comparison: Plain films left foot 09/21/2020.

CLINICAL DATA: Pain on the plantar surface of the left foot since
left foot surgery July 01, 2020 when the patient underwent distal
second and third metatarsal osteotomies.

EXAM:
MRI OF THE LEFT FOREFOOT WITHOUT AND WITH CONTRAST
TECHNIQUE: Multiplanar, multisequence MR imaging of the left forefoot was
performed both before and after administration of intravenous
contrast.
CONTRAST:  14 mL MULTIHANCE GADOBENATE DIMEGLUMINE 529 MG/ML IV SOLN

[Series 4: T1 · coronal · 3.0mm · 0.25mm/px · 6 of 47 slices shown (1 of 3)]
[im 1/47]
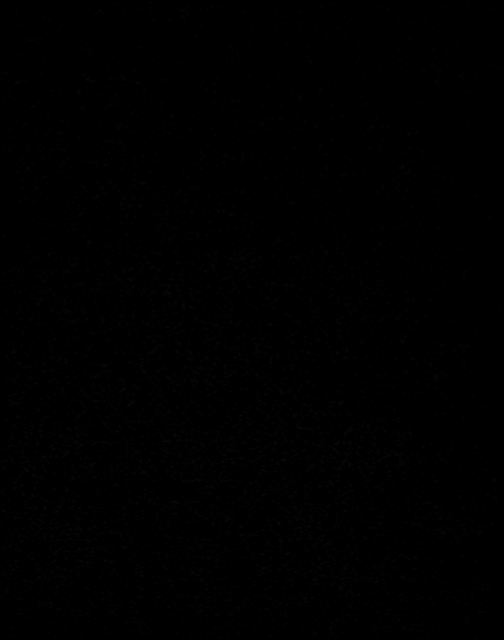
[im 10/47]
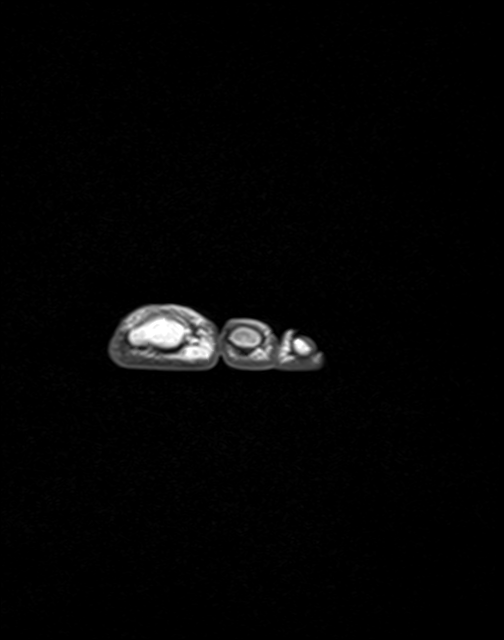
[im 19/47]
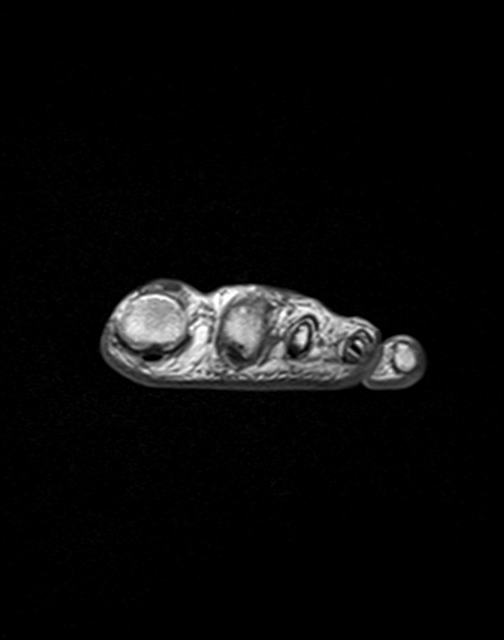
[im 28/47]
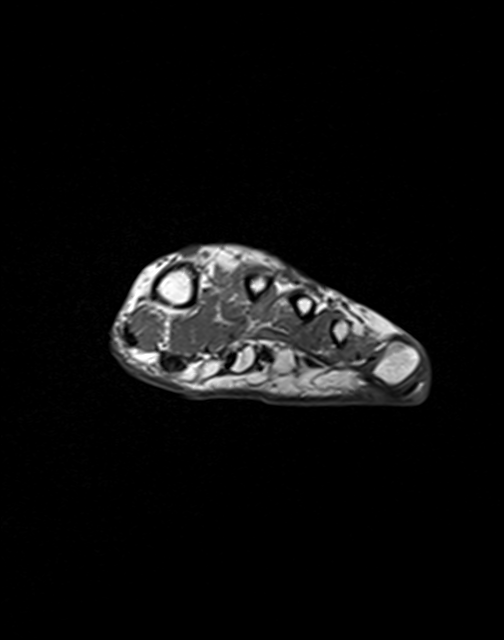
[im 37/47]
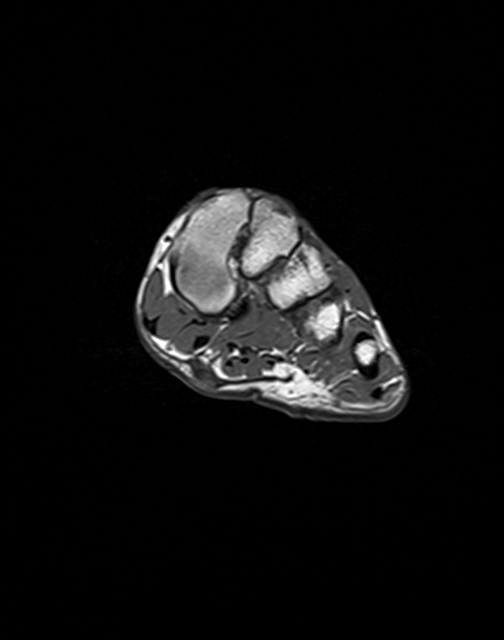
[im 47/47]
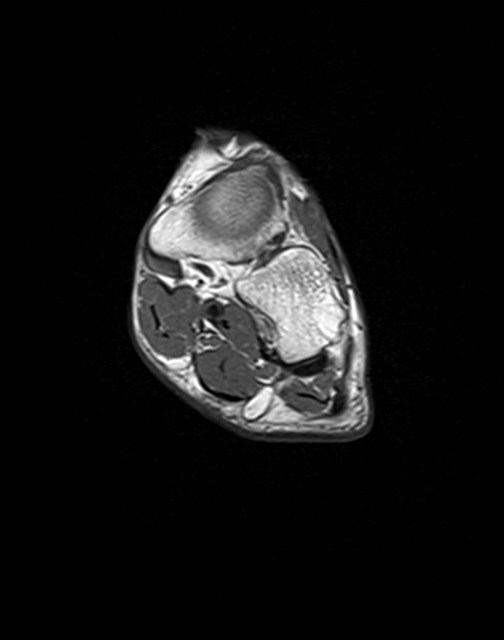

[Series 5: T2 fat-sat · coronal · 3.0mm · 0.22mm/px · 6 of 47 slices shown (1 of 2)]
[im 1/47]
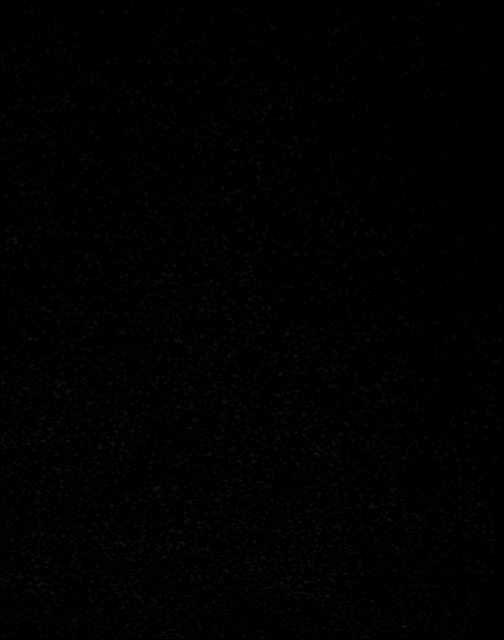
[im 10/47]
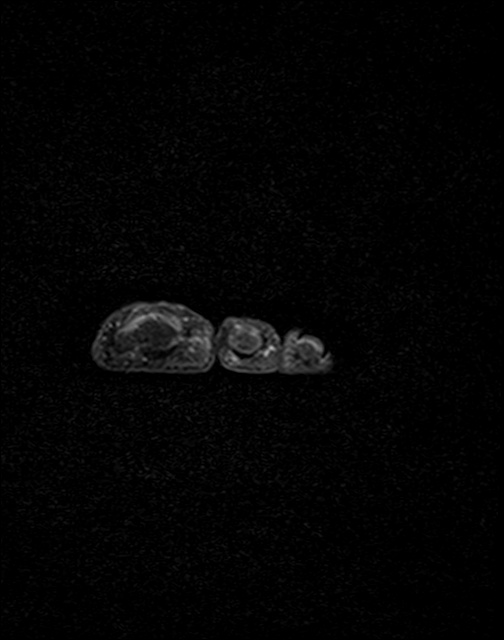
[im 19/47]
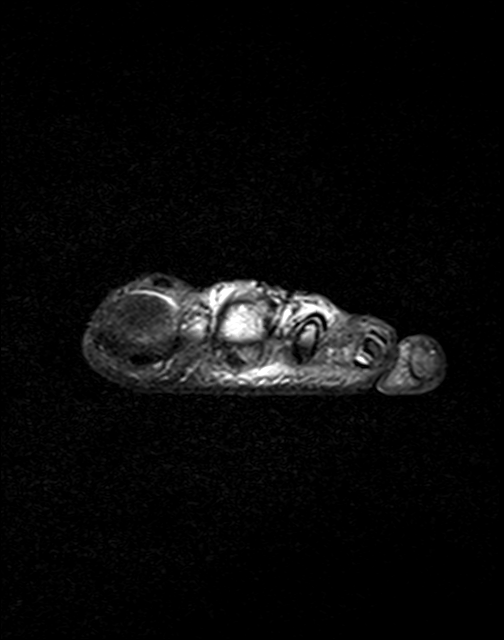
[im 28/47]
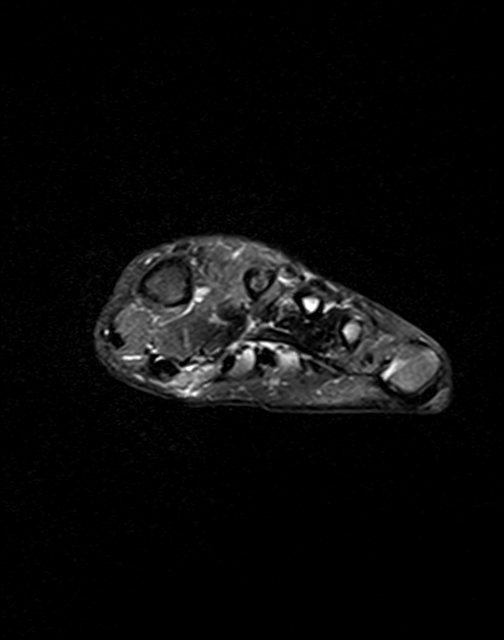
[im 37/47]
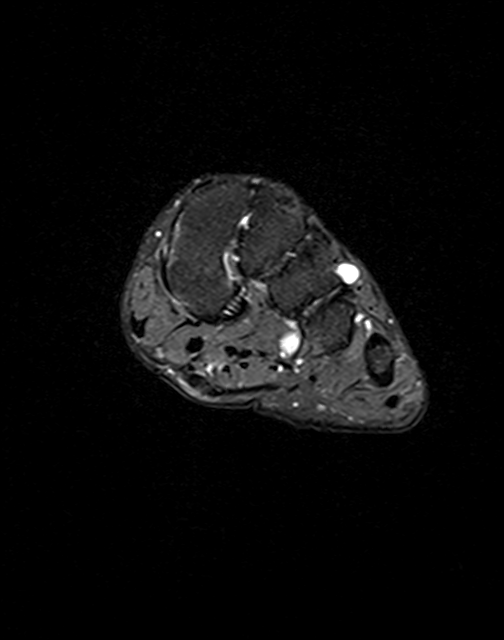
[im 47/47]
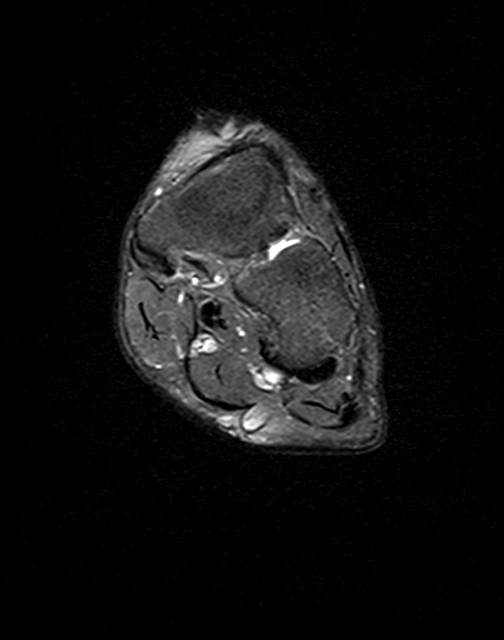

[Series 6: T2 fat-sat · axial · 3.0mm · 0.35mm/px · z∈[-43,+38]mm · 3 of 22 slices shown (2 of 2)]
[im 1/22]
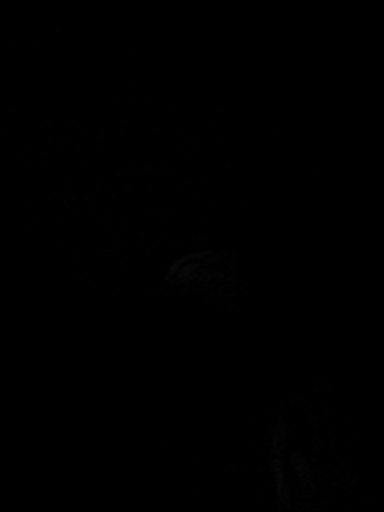
[im 11/22]
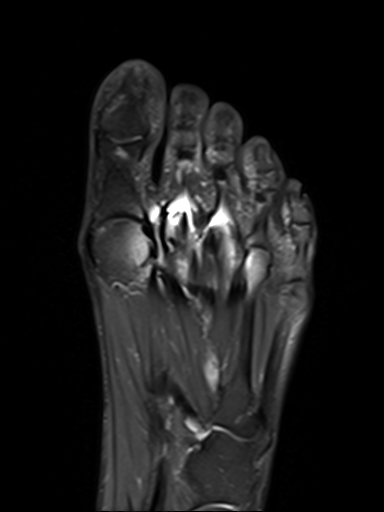
[im 22/22]
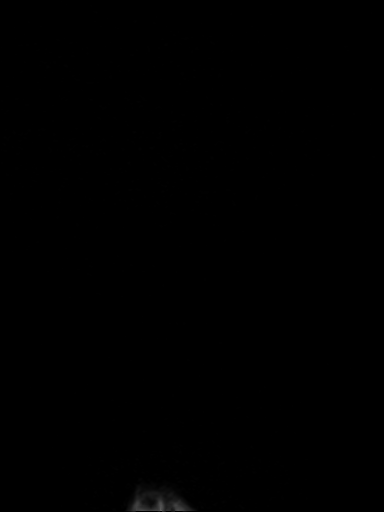

[Series 7: T1 · axial · 3.0mm · 0.35mm/px · z∈[-43,+38]mm · 3 of 22 slices shown (2 of 3)]
[im 1/22]
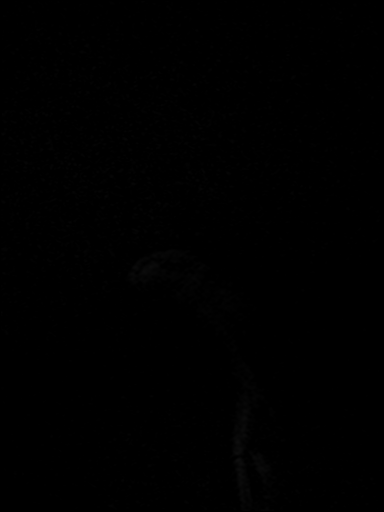
[im 11/22]
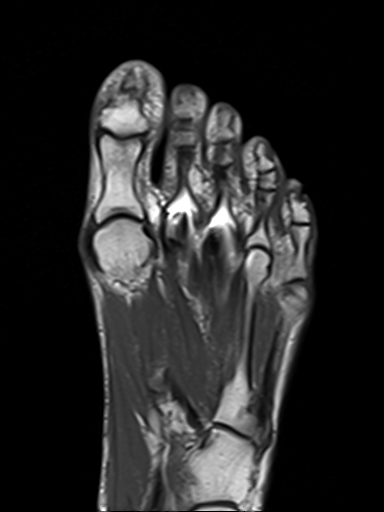
[im 22/22]
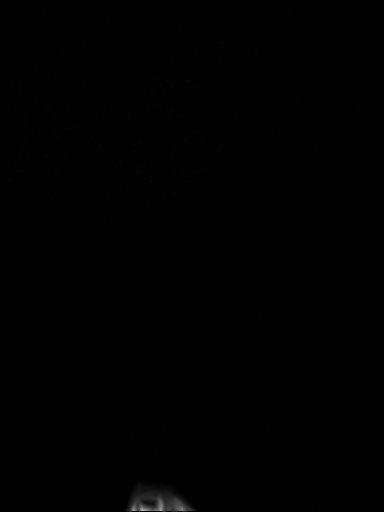

[Series 9: T1 · coronal · 3.0mm · 0.22mm/px · 1 of 47 slices shown (3 of 3)]
[im 1/47]
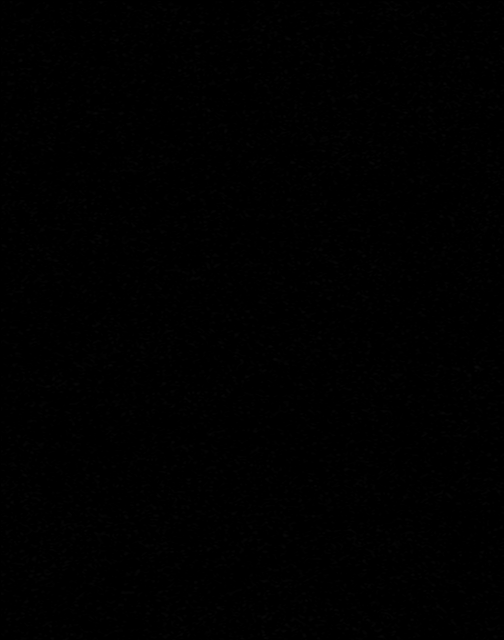

[19 of 40 positions shown; findings below may reference images not displayed]

FINDINGS: Bones/Joint/Cartilage

There is artifact on the examination from screws in the distal
second and third metatarsals for fixation of osteotomy defects. The
defects are obscured due to artifact. No fracture, stress change or
worrisome lesion is identified. There is no joint effusion or
evidence of arthropathy.

Ligaments

Intact.

Muscles and Tendons

Intact.

Soft tissues

Appear normal without fluid collection or mass.
IMPRESSION: Status post distal second and third metatarsal osteotomy. No acute
or focal abnormality is identified. No finding to explain the
patient's symptoms.

## 2021-06-09 NOTE — Telephone Encounter (Signed)
I'm calling to let you know that I have your medical records ready as well as the xray CD.  I apologize for the delay in getting it to you because I was out of the office last week.  "So, is it all of it?"  Yes, it's all your medical records and I have a disk made of your xrays.  "I'll come by there today to pick it up."
